# Patient Record
Sex: Female | Born: 1940
Health system: Southern US, Community
[De-identification: ages and names within clinical notes are randomized; demographics above are authoritative.]

## PROBLEM LIST (undated history)

## (undated) DIAGNOSIS — I10 Essential (primary) hypertension: Secondary | ICD-10-CM

## (undated) DIAGNOSIS — R001 Bradycardia, unspecified: Secondary | ICD-10-CM

## (undated) HISTORY — PX: ABDOMINAL HYSTERECTOMY: SHX81

## (undated) HISTORY — DX: Bradycardia, unspecified: R00.1

---

## 2014-08-26 ENCOUNTER — Inpatient Hospital Stay (HOSPITAL_COMMUNITY)
Admission: EM | Admit: 2014-08-26 | Discharge: 2014-08-28 | DRG: 310 | Disposition: A | Discharge status: Home / Self Care | Payer: Medicare Other | Attending: Internal Medicine | Admitting: Internal Medicine

## 2014-08-26 ENCOUNTER — Other Ambulatory Visit (HOSPITAL_COMMUNITY): Payer: Self-pay

## 2014-08-26 ENCOUNTER — Inpatient Hospital Stay (HOSPITAL_COMMUNITY): Payer: Medicare Other

## 2014-08-26 ENCOUNTER — Encounter (HOSPITAL_COMMUNITY): Payer: Self-pay | Admitting: Emergency Medicine

## 2014-08-26 ENCOUNTER — Emergency Department (HOSPITAL_COMMUNITY): Payer: Medicare Other

## 2014-08-26 DIAGNOSIS — R001 Bradycardia, unspecified: Secondary | ICD-10-CM | POA: Diagnosis present

## 2014-08-26 DIAGNOSIS — Z91013 Allergy to seafood: Secondary | ICD-10-CM

## 2014-08-26 DIAGNOSIS — Z950 Presence of cardiac pacemaker: Secondary | ICD-10-CM

## 2014-08-26 DIAGNOSIS — R079 Chest pain, unspecified: Secondary | ICD-10-CM

## 2014-08-26 DIAGNOSIS — I10 Essential (primary) hypertension: Secondary | ICD-10-CM | POA: Diagnosis present

## 2014-08-26 DIAGNOSIS — R0602 Shortness of breath: Secondary | ICD-10-CM

## 2014-08-26 DIAGNOSIS — I455 Other specified heart block: Secondary | ICD-10-CM | POA: Diagnosis present

## 2014-08-26 DIAGNOSIS — Z959 Presence of cardiac and vascular implant and graft, unspecified: Secondary | ICD-10-CM

## 2014-08-26 DIAGNOSIS — T82529D Displacement of unspecified cardiac and vascular devices and implants, subsequent encounter: Secondary | ICD-10-CM

## 2014-08-26 HISTORY — DX: Essential (primary) hypertension: I10

## 2014-08-26 LAB — CBC WITH DIFFERENTIAL/PLATELET
BASOS ABS: 0 10*3/uL (ref 0.0–0.1)
Basophils Relative: 0 % (ref 0–1)
Eosinophils Absolute: 0.2 10*3/uL (ref 0.0–0.7)
Eosinophils Relative: 2 % (ref 0–5)
HCT: 39.7 % (ref 36.0–46.0)
Hemoglobin: 12.6 g/dL (ref 12.0–15.0)
LYMPHS PCT: 26 % (ref 12–46)
Lymphs Abs: 2.5 10*3/uL (ref 0.7–4.0)
MCH: 25.6 pg — AB (ref 26.0–34.0)
MCHC: 31.7 g/dL (ref 30.0–36.0)
MCV: 80.7 fL (ref 78.0–100.0)
Monocytes Absolute: 0.5 10*3/uL (ref 0.1–1.0)
Monocytes Relative: 5 % (ref 3–12)
NEUTROS ABS: 6.6 10*3/uL (ref 1.7–7.7)
Neutrophils Relative %: 67 % (ref 43–77)
Platelets: 278 10*3/uL (ref 150–400)
RBC: 4.92 MIL/uL (ref 3.87–5.11)
RDW: 14.9 % (ref 11.5–15.5)
WBC: 9.8 10*3/uL (ref 4.0–10.5)

## 2014-08-26 LAB — MRSA PCR SCREENING: MRSA by PCR: NEGATIVE

## 2014-08-26 LAB — BASIC METABOLIC PANEL
Anion gap: 8 (ref 5–15)
BUN: 17 mg/dL (ref 6–23)
CALCIUM: 11 mg/dL — AB (ref 8.4–10.5)
CO2: 25 mmol/L (ref 19–32)
CREATININE: 1.13 mg/dL — AB (ref 0.50–1.10)
Chloride: 106 mmol/L (ref 96–112)
GFR calc Af Amer: 55 mL/min — ABNORMAL LOW (ref >90)
GFR calc non Af Amer: 48 mL/min — ABNORMAL LOW (ref >90)
GLUCOSE: 134 mg/dL — AB (ref 70–99)
Potassium: 4.6 mmol/L (ref 3.5–5.1)
SODIUM: 139 mmol/L (ref 135–145)

## 2014-08-26 LAB — TROPONIN I: TROPONIN I: 0.04 ng/mL — AB (ref <0.031)

## 2014-08-26 MED ORDER — ACETAMINOPHEN 325 MG PO TABS: 650.0000 mg | ORAL_TABLET | ORAL | Status: DC | PRN

## 2014-08-26 MED ORDER — ONDANSETRON HCL 4 MG/2ML IJ SOLN: 4.0000 mg | Freq: Four times a day (QID) | INTRAMUSCULAR | Status: DC | PRN

## 2014-08-26 MED ORDER — LORAZEPAM 2 MG/ML IJ SOLN
1.0000 mg | Freq: Once | INTRAMUSCULAR | Status: AC
  Administered 2014-08-26: 1 mg via INTRAVENOUS

## 2014-08-26 MED ORDER — MIDAZOLAM HCL 2 MG/2ML IJ SOLN
INTRAMUSCULAR | Status: AC
  Filled 2014-08-26: qty 2

## 2014-08-26 MED ORDER — CALCIUM CHLORIDE 10 % IV SOLN
1.0000 g | Freq: Once | INTRAVENOUS | Status: AC
  Administered 2014-08-26: 1 g via INTRAVENOUS

## 2014-08-26 MED ORDER — FENTANYL CITRATE 0.05 MG/ML IJ SOLN
25.0000 ug | Freq: Once | INTRAMUSCULAR | Status: AC
  Administered 2014-08-26: 25 ug via INTRAVENOUS

## 2014-08-26 MED ORDER — GLUCAGON HCL RDNA (DIAGNOSTIC) 1 MG IJ SOLR
2.0000 mg | Freq: Once | INTRAMUSCULAR | Status: AC
  Administered 2014-08-26: 2 mg via INTRAVENOUS

## 2014-08-26 MED ORDER — ASPIRIN EC 81 MG PO TBEC
81.0000 mg | DELAYED_RELEASE_TABLET | Freq: Every day | ORAL | Status: DC
  Administered 2014-08-27 – 2014-08-28 (×2): 81 mg via ORAL
  Filled 2014-08-26 (×2): qty 1

## 2014-08-26 MED ORDER — SODIUM CHLORIDE 0.9 % IV SOLN
INTRAVENOUS | Status: DC
  Administered 2014-08-26: 18:00:00 via INTRAVENOUS

## 2014-08-26 MED ORDER — MIDAZOLAM HCL 2 MG/2ML IJ SOLN
1.0000 mg | Freq: Once | INTRAMUSCULAR | Status: AC
  Administered 2014-08-26: 1 mg via INTRAVENOUS

## 2014-08-26 MED ORDER — NITROGLYCERIN 0.4 MG SL SUBL: 0.4000 mg | SUBLINGUAL_TABLET | SUBLINGUAL | Status: DC | PRN

## 2014-08-26 MED ORDER — SODIUM CHLORIDE 0.9 % IV SOLN
INTRAVENOUS | Status: DC
  Administered 2014-08-26: 14:00:00 via INTRAVENOUS

## 2014-08-26 NOTE — ED Notes (Signed)
Cardiology at the bedside. X-ray at the bedside with the C arm to place external pacemaker.

## 2014-08-26 NOTE — ED Provider Notes (Signed)
CSN: [id number]     Arrival date & time 08/26/14  1348 History   First MD Initiated Contact with Patient 08/26/14 1356     Chief Complaint  Patient presents with  . Bradycardia     (Consider location/radiation/quality/duration/timing/severity/associated sxs/prior Treatment) HPI Comments: Patient here with son onset of loss of consciousness while in church. Patient denied any chest pain chest pressure. States she feels that the room was spinning. EMS was called and patient found to have a heart rate of 20 and in a sinus bradycardia. She was given atropine 0.5 mg if no increase of heart rate. Blood pressure was noted to be 80 and patient given IV fluids and was externally paced. She denies any prior history of coronary artery disease. No prior history of arrhythmias. She is on a beta blocker and calcium channel blocker. She presents at this time  The history is provided by the patient.    No past medical history on file. No past surgical history on file. No family history on file. History  Substance Use Topics  . Smoking status: Not on file  . Smokeless tobacco: Not on file  . Alcohol Use: Not on file   OB History    No data available     Review of Systems  All other systems reviewed and are negative.     Allergies  Review of patient's allergies indicates not on file.  Home Medications   Prior to Admission medications   Not on File   BP 122/84 mmHg  Pulse 70  Temp(Src) 97.9 F (36.6 C) (Oral)  Resp 22  Ht  (1.549 m)  Wt 160 lb (72.576 kg)  BMI 30.25 kg/m2  SpO2 100% Physical Exam  Constitutional: She is oriented to person, place, and time. She appears well-developed and well-nourished.  Non-toxic appearance. No distress.  HENT:  Head: Normocephalic and atraumatic.  Eyes: Conjunctivae, EOM and lids are normal. Pupils are equal, round, and reactive to light.  Neck: Normal range of motion. Neck supple. No tracheal deviation present. No thyroid mass present.   Cardiovascular: Regular rhythm and normal heart sounds.  Bradycardia present.  Exam reveals no gallop.   No murmur heard. Pulmonary/Chest: Effort normal and breath sounds normal. No stridor. No respiratory distress. She has no decreased breath sounds. She has no wheezes. She has no rhonchi. She has no rales.  Abdominal: Soft. Normal appearance and bowel sounds are normal. She exhibits no distension. There is no tenderness. There is no rebound and no CVA tenderness.  Musculoskeletal: Normal range of motion. She exhibits no edema or tenderness.  Neurological: She is alert and oriented to person, place, and time. She has normal strength. No cranial nerve deficit or sensory deficit. GCS eye subscore is 4. GCS verbal subscore is 5. GCS motor subscore is 6.  Skin: Skin is warm and dry. No abrasion and no rash noted.  Psychiatric: Her affect is blunt.  Nursing note and vitals reviewed.   ED Course  Procedures (including critical care time) Labs Review Labs Reviewed  CBC WITH DIFFERENTIAL/PLATELET  TROPONIN I  BASIC METABOLIC PANEL    Imaging Review No results found.   EKG Interpretation   Date/Time:  Saturday August 26 2014 13:51:33 EST Ventricular Rate:  70 PR Interval:    QRS Duration: 258 QT Interval:  528 QTC Calculation: 570 R Axis:   -48 Text Interpretation:  Poor data quality in current ECG precludes serial  comparison Confirmed by Marja Adderley  MD, Tahjai Schetter ([id number]) on 08/26/2014  2:05:12 PM     Patient is in a paced rhythm at this time at a rate of 70. MDM   Final diagnoses:  Chest pain   patient is externally paced at this time. She has been given calcium chloride as well as glucagon for possible calcium channel and/or beta blocker excessive effects. Cardiologist will be consult and patient be admitted to the service   CRITICAL CARE Performed by: Toy BakerALLEN,Laquitha Heslin T Total critical care time: 45 Critical care time was exclusive of separately billable procedures and treating other  patients. Critical care was necessary to treat or prevent imminent or life-threatening deterioration. Critical care was time spent personally by me on the following activities: development of treatment plan with patient and/or surrogate as well as nursing, discussions with consultants, evaluation of patient's response to treatment, examination of patient, obtaining history from patient or surrogate, ordering and performing treatments and interventions, ordering and review of laboratory studies, ordering and review of radiographic studies, pulse oximetry and re-evaluation of patient's condition.       Toy BakerAnthony T Jersie Beel, MD 08/26/14 [phone]

## 2014-08-26 NOTE — ED Notes (Signed)
Family at bedside. Updated  By MD , awaiting cath lab

## 2014-08-26 NOTE — ED Notes (Signed)
Pacemaker line placed under fluro.

## 2014-08-26 NOTE — Progress Notes (Signed)
eLink Physician-Brief Progress Note Patient Name: Kristi BinetHazel Ward DOB: Dec 26, 1940 MRN: [id number]   Date of Service  08/26/2014  HPI/Events of Note  74 yo with symptomatic bradycardia, now with temporary pacemaker  eICU Interventions  Avoid nodal blocker agents DVT\GI prophylaxis Further management per cardiology.      Intervention Category Evaluation Type: New Patient Evaluation  Lewellyn Fultz 08/26/2014, 6:04 PM

## 2014-08-26 NOTE — H&P (Signed)
    Leodis BinetHazel Gadberry is an 74 y.o. female.    Primary Cardiologist:new No primary care provider on file.  Chief Complaint: weak HPI: 74 year old female has not felt well for 2 months but worse today.  She was at church and felt the room spinning, then lost consciousness.  EMS arrived, HR 20 sinus brady.  She has been temporarily paced since then. Atropine, calcium, and glucagon were given without effect.  SBP > 200 with temporary pacing.   She is on Coreg 25 mg bid and verapamil at home for HTN.  No prior cardiology evaluation.   Past Medical History  Diagnosis Date  . Hypertension     Past Surgical History  Procedure Laterality Date  . Abdominal hysterectomy      History reviewed. No pertinent family history. no cardiac issues  Social History:  reports that she has never smoked. She does not have any smokeless tobacco history on file. She reports that she does not drink alcohol or use illicit drugs.  Allergies:  Allergies  Allergen Reactions  . Crab [Shellfish Allergy]     Coreg verapamil No results found for this or any previous visit (from the past 48 hour(s)). No results found.  ROS: Unable to obtain due to discomfort   Blood pressure 122/84, pulse 70, temperature 97.9 F (36.6 C), temperature source Oral, resp. rate 22, height 5\' 1"  (1.549 m), weight 160 lb (72.576 kg), SpO2 100 %. General: uncomfortable, temporary pacing Neck: No JVD, no thyromegaly or thyroid nodule.  Lungs: Clear to auscultation bilaterally with normal respiratory effort. CV: Nondisplaced PMI.  External pacing, no murmur.  No peripheral edema.  No carotid bruit.  Normal pedal pulses.  Abdomen: Soft, nontender, no hepatosplenomegaly, no distention.  Skin: Intact without lesions or rashes.  Neurologic: Alert and oriented x 3.  Psych: Normal affect. Extremities: No clubbing or cyanosis.  HEENT: Normal.    Assessment/Plan Symptomatic bradycardia HR 19 HTN  INGOLD,LAURA R Nurse Practitioner  Certified [facility] Pager [phone] or after 5pm or weekends call [phone] 08/26/2014, 2:16 PM   Patient seen with NP, agree with the above note.  Profound bradycardia due to sinus node dysfunction (sinus bradycardia initially) in the setting of Coreg and verapamil use.  She will need temporary transvenous pacing today.  Will then allow Coreg and verapamil wash-out to decide on PPM (will likely need).  She will need echo.   Marca AnconaDalton Alicia Seib 08/26/2014 2:26 PM

## 2014-08-26 NOTE — Significant Event (Signed)
  Pacer noticed to be atrial location due to lack of capture.   Attempted to use Fluoro, the fluoro machine did not fit under the bed  Advanced pacer wire with balloon inflated with ventricular capture, however, patient started moving and wired fell back into atrium. The temp pacer does not have a lock.   Patient remains with native rate at 50s-70s, and pads on, she is hemodynamically stable and asymptomatic. Plan to closely monitor patient and adjust or pull pacer wire in AM in cath lab.

## 2014-08-26 NOTE — ED Notes (Signed)
Attempted to cut of temporary pacer, upon cutting pacer off pt heart rate down to 19, pt went slightly unresponsive with snoring respirations , temp pacer cut back on

## 2014-08-26 NOTE — CV Procedure (Signed)
Leodis BinetHazel Ward [id number]  [id number]  Preop Dx: sinus arrest symptomatic brfadycardia Postop Dx same/   Procedure temp   Cx: None   EBL: Minimal   After reoutine prep and drape, lidocaine infiltrated andn venous access obrtained wirth placement of ballloon tipped catheter into ERV apex with threshookld < 0.5  The pt jumped with her leg, and pacing was then noted with narrow qrs and flouro confirmed atrial location  Lead redeployed in RV apex undedr flouro threshold < 0.5   Pt admitted   Tolerated well   Sherryl MangesSteven Klein, MD 08/26/2014 3:38 PM

## 2014-08-26 NOTE — ED Notes (Signed)
Pt here from church after being called out for unconscious, pt hr was 20 at the church  SB on monitor , pt received .5mg  of atropine that did not help , pt was then paced by ems , pt arrived still being paced

## 2014-08-27 ENCOUNTER — Inpatient Hospital Stay (HOSPITAL_COMMUNITY): Payer: Medicare Other

## 2014-08-27 DIAGNOSIS — I455 Other specified heart block: Secondary | ICD-10-CM

## 2014-08-27 DIAGNOSIS — R55 Syncope and collapse: Secondary | ICD-10-CM

## 2014-08-27 LAB — BASIC METABOLIC PANEL
ANION GAP: 7 (ref 5–15)
BUN: 9 mg/dL (ref 6–23)
CO2: 25 mmol/L (ref 19–32)
Calcium: 8.9 mg/dL (ref 8.4–10.5)
Chloride: 109 mmol/L (ref 96–112)
Creatinine, Ser: 0.76 mg/dL (ref 0.50–1.10)
GFR calc Af Amer: >90 mL/min (ref >90)
GFR calc non Af Amer: 83 mL/min — ABNORMAL LOW (ref >90)
GLUCOSE: 92 mg/dL (ref 70–99)
POTASSIUM: 3.9 mmol/L (ref 3.5–5.1)
Sodium: 141 mmol/L (ref 135–145)

## 2014-08-27 LAB — CBC
HCT: 36 % (ref 36.0–46.0)
Hemoglobin: 11.1 g/dL — ABNORMAL LOW (ref 12.0–15.0)
MCH: 24.9 pg — ABNORMAL LOW (ref 26.0–34.0)
MCHC: 30.8 g/dL (ref 30.0–36.0)
MCV: 80.9 fL (ref 78.0–100.0)
Platelets: 371 10*3/uL (ref 150–400)
RBC: 4.45 MIL/uL (ref 3.87–5.11)
RDW: 15 % (ref 11.5–15.5)
WBC: 9.5 10*3/uL (ref 4.0–10.5)

## 2014-08-27 LAB — TSH: TSH: 2.033 u[IU]/mL (ref 0.350–4.500)

## 2014-08-27 LAB — TROPONIN I: Troponin I: 0.08 ng/mL — ABNORMAL HIGH (ref <0.031)

## 2014-08-27 MED ORDER — HYDRALAZINE HCL 50 MG PO TABS
50.0000 mg | ORAL_TABLET | Freq: Three times a day (TID) | ORAL | Status: DC
  Administered 2014-08-27 – 2014-08-28 (×4): 50 mg via ORAL
  Filled 2014-08-27 (×6): qty 1

## 2014-08-27 MED ORDER — HYDRALAZINE HCL 20 MG/ML IJ SOLN
20.0000 mg | Freq: Once | INTRAMUSCULAR | Status: AC
  Administered 2014-08-27: 20 mg via INTRAVENOUS

## 2014-08-27 MED ORDER — SODIUM CHLORIDE 0.9 % IV SOLN
INTRAVENOUS | Status: DC
  Administered 2014-08-26: 20:00:00 via INTRAVENOUS

## 2014-08-27 MED ORDER — HYDRALAZINE HCL 25 MG PO TABS
25.0000 mg | ORAL_TABLET | Freq: Four times a day (QID) | ORAL | Status: DC
  Administered 2014-08-27: 25 mg via ORAL
  Filled 2014-08-27 (×4): qty 1

## 2014-08-27 MED ORDER — HYDRALAZINE HCL 20 MG/ML IJ SOLN
INTRAMUSCULAR | Status: AC
  Filled 2014-08-27: qty 1

## 2014-08-27 NOTE — Progress Notes (Signed)
  Echocardiogram 2D Echocardiogram has been performed.  Kristi Ward R 08/27/2014, 3:12 PM 

## 2014-08-27 NOTE — Progress Notes (Signed)
  Echocardiogram 2D Echocardiogram has been performed.  Janalyn HarderWest, Dajohn Ellender R 08/27/2014, 3:12 PM

## 2014-08-27 NOTE — Progress Notes (Signed)
Patient ID: Kristi BinetHazel Ward, female   DOB: [birth year], 74 y.o.   MRN: [id number]   SUBJECTIVE: Temporary transvenous pacemaker placed yesterday but lost capture and was unable to be successfully repositioned at bedside.  However, patient has been in NSR with HR ranging from 50s-70s.  No complaints, no dyspnea or chest pain.  Scheduled Meds: . aspirin EC  81 mg Oral Daily   Continuous Infusions: . sodium chloride 50 mL/hr at 08/26/14 1815  . sodium chloride 10 mL/hr at 08/26/14 2000   PRN Meds:.acetaminophen, nitroGLYCERIN, ondansetron (ZOFRAN) IV    Filed Vitals:   08/27/14 0300 08/27/14 0400 08/27/14 0500 08/27/14 0600  BP: 175/71 174/51 169/66 153/59  Pulse: 71 55 65 55  Temp:  97.4 F (36.3 C)    TempSrc:  Oral    Resp: 18 23 21 19   Height:      Weight:      SpO2: 100% 97% 97% 98%    Intake/Output Summary (Last 24 hours) at 08/27/14 0743 Last data filed at 08/27/14 0600  Gross per 24 hour  Intake  687.5 ml  Output   1600 ml  Net -912.5 ml    LABS: Basic Metabolic Panel:  Recent Labs  11/91/4701/11/05 1407  NA 139  K 4.6  CL 106  CO2 25  GLUCOSE 134*  BUN 17  CREATININE 1.13*  CALCIUM 11.0*   Liver Function Tests: No results for input(s): AST, ALT, ALKPHOS, BILITOT, PROT, ALBUMIN in the last 72 hours. No results for input(s): LIPASE, AMYLASE in the last 72 hours. CBC:  Recent Labs  08/26/14 1407  WBC 9.8  NEUTROABS 6.6  HGB 12.6  HCT 39.7  MCV 80.7  PLT 278   Cardiac Enzymes:  Recent Labs  08/26/14 1407  TROPONINI 0.04*   BNP: Invalid input(s): POCBNP D-Dimer: No results for input(s): DDIMER in the last 72 hours. Hemoglobin A1C: No results for input(s): HGBA1C in the last 72 hours. Fasting Lipid Panel: No results for input(s): CHOL, HDL, LDLCALC, TRIG, CHOLHDL, LDLDIRECT in the last 72 hours. Thyroid Function Tests:  Recent Labs  08/26/14 2235  TSH 2.033   Anemia Panel: No results for input(s): VITAMINB12, FOLATE, FERRITIN, TIBC, IRON,  RETICCTPCT in the last 72 hours.  RADIOLOGY: Dg Chest Port 1 View  08/27/2014   CLINICAL DATA:  Displacement of cardiac device.  EXAM: PORTABLE CHEST - 1 VIEW  COMPARISON:  08/26/2014 at 2327 hours  FINDINGS: A temporary pacer wire from below projects over the lower right atrium, near the inferior cavoatrial junction.  Stable cardiomegaly. Stable aortic tortuosity. No failure. No effusion or pneumothorax.  IMPRESSION: 1. Temporary pacer wire is near the inferior cavoatrial junction. 2. Cardiomegaly without failure.   Electronically Signed   By: Marnee SpringJonathon  Watts M.D.   On: 08/27/2014 02:58   Portable Chest X-ray 1 View  08/27/2014   CLINICAL DATA:  Follow up cardiac device lead position. Subsequent encounter.  EXAM: PORTABLE CHEST - 1 VIEW  COMPARISON:  Chest radiograph performed earlier today at 4:01 p.m.  FINDINGS: A metallic device is noted overlying the mediastinum, with associated leads overlying the mediastinum. One of the leads appears to have been repositioned since the prior study. A lead is seen ending overlying the expected location of the right ventricle.  Vascular congestion is noted. An external pacing pad is seen on the left. No pleural effusion or pneumothorax is seen.  The cardiomediastinal silhouette is enlarged. No acute osseous abnormalities are identified.  IMPRESSION: 1. Lead noted ending overlying  the expected location of the right ventricle. 2. At congestion and cardiomegaly; lungs remain grossly clear.   Electronically Signed   By: Roanna Raider M.D.   On: 08/27/2014 01:06   Dg Chest Port 1 View  08/27/2014   CLINICAL DATA:  Shortness of breath  EXAM: PORTABLE CHEST - 1 VIEW  COMPARISON:  08/26/2014  FINDINGS: Cardiomegaly and aortic tortuosity which is stable from priors. With improved inflation previously noted pulmonary venous congestion is now not apparent. There is no edema, consolidation, effusion, or pneumothorax. Previously noted pacer wire is not visible.  IMPRESSION: 1.  Previously noted pacer wire is not visible, possibly due to underpenetration. 2. Cardiomegaly without failure.   Electronically Signed   By: Marnee Spring M.D.   On: 08/27/2014 00:05   Dg Chest Port 1 View  08/26/2014   CLINICAL DATA:  Acute chest pain.  EXAM: PORTABLE CHEST - 1 VIEW  COMPARISON:  None.  FINDINGS: Cardiomegaly noted.  Mild pulmonary vascular congestion is identified.  A defibrillator pad overlying the left chest is identified.  There is no evidence of focal airspace disease, pulmonary edema, suspicious pulmonary nodule/mass, pleural effusion, or pneumothorax. No acute bony abnormalities are identified.  IMPRESSION: Cardiomegaly with mild pulmonary vascular congestion.   Electronically Signed   By: Harmon Pier M.D.   On: 08/26/2014 17:09   Dg Fluoro Guide Cv Line-no Report  08/26/2014   CLINICAL DATA:    FLOURO GUIDE CV LINE  Fluoroscopy was utilized by the requesting physician.  No radiographic  interpretation.     PHYSICAL EXAM General: NAD Neck: No JVD, no thyromegaly or thyroid nodule.  Lungs: Clear to auscultation bilaterally with normal respiratory effort. CV: Nondisplaced PMI.  Heart regular S1/S2, no S3/S4, no murmur.  No peripheral edema.  No carotid bruit.  Normal pedal pulses.  Abdomen: Soft, nontender, no hepatosplenomegaly, no distention.  Neurologic: Alert and oriented x 3.  Psych: Normal affect. Extremities: No clubbing or cyanosis.   TELEMETRY: Reviewed telemetry pt in NSR rate 50s-70s  ASSESSMENT AND PLAN: 74 yo with history of HTN on Coreg and verapamil and high doses presented with profound bradycardia and loss of consciousness.  Temporary transvenous pacer was placed but lost capture with leg movement.  Last night, it could not be successfully repositioned.  However, she has had a normal native rhythm overnight and this morning, HR 50s-70s NSR.  - I turned off the pacer this morning.  Will observe through the day.  If she becomes bradycardic can reposition  in cath lab.  If not, it can be removed this afternoon.  - Echocardiogram today.  - Needs BMET/CBC.   [name] 08/27/2014 7:47 AM

## 2014-08-27 NOTE — Progress Notes (Signed)
MD notified of pacer not firing appropriately or capturing. MD to bedside. 12 EKG performed. Chest XRay obtained. Pacer wires misplaced. MD attempted to advance pacer wires unsuccessfully. Pt native HR 70s-80s while awake. Zoll pads in place. Vital signs stable. Foley placed to maintain pacer wire placement. Will continue to monitor.

## 2014-08-27 NOTE — Progress Notes (Addendum)
Foley catheter inserted using sterile technique with Edson SnowballShana RN. Peri care preformed prior to insertion. Foley inserted for maintenance of temporary pacer placement and acute urinary retention.

## 2014-08-28 ENCOUNTER — Encounter (HOSPITAL_COMMUNITY)
Admission: EM | Disposition: A | Discharge status: Home / Self Care | Payer: Self-pay | Source: Home / Self Care | Attending: Internal Medicine

## 2014-08-28 ENCOUNTER — Other Ambulatory Visit: Payer: Self-pay | Admitting: Physician Assistant

## 2014-08-28 ENCOUNTER — Encounter (HOSPITAL_COMMUNITY): Payer: Self-pay | Admitting: *Deleted

## 2014-08-28 DIAGNOSIS — E876 Hypokalemia: Secondary | ICD-10-CM

## 2014-08-28 DIAGNOSIS — R001 Bradycardia, unspecified: Secondary | ICD-10-CM

## 2014-08-28 DIAGNOSIS — I10 Essential (primary) hypertension: Secondary | ICD-10-CM | POA: Diagnosis present

## 2014-08-28 LAB — CBC
HEMATOCRIT: 36.5 % (ref 36.0–46.0)
Hemoglobin: 11.2 g/dL — ABNORMAL LOW (ref 12.0–15.0)
MCH: 24.7 pg — ABNORMAL LOW (ref 26.0–34.0)
MCHC: 30.7 g/dL (ref 30.0–36.0)
MCV: 80.6 fL (ref 78.0–100.0)
PLATELETS: 378 10*3/uL (ref 150–400)
RBC: 4.53 MIL/uL (ref 3.87–5.11)
RDW: 15.1 % (ref 11.5–15.5)
WBC: 9.4 10*3/uL (ref 4.0–10.5)

## 2014-08-28 LAB — BASIC METABOLIC PANEL
ANION GAP: 4 — AB (ref 5–15)
BUN: 11 mg/dL (ref 6–23)
CHLORIDE: 106 mmol/L (ref 96–112)
CO2: 29 mmol/L (ref 19–32)
Calcium: 8.8 mg/dL (ref 8.4–10.5)
Creatinine, Ser: 0.81 mg/dL (ref 0.50–1.10)
GFR calc non Af Amer: 71 mL/min — ABNORMAL LOW (ref >90)
GFR, EST AFRICAN AMERICAN: 83 mL/min — AB (ref >90)
Glucose, Bld: 98 mg/dL (ref 70–99)
Potassium: 3.4 mmol/L — ABNORMAL LOW (ref 3.5–5.1)
SODIUM: 139 mmol/L (ref 135–145)

## 2014-08-28 LAB — HEMOGLOBIN A1C
Hgb A1c MFr Bld: 6.3 % — ABNORMAL HIGH (ref 4.8–5.6)
Mean Plasma Glucose: 134 mg/dL

## 2014-08-28 SURGERY — PERMANENT PACEMAKER INSERTION
Anesthesia: LOCAL

## 2014-08-28 MED ORDER — SPIRONOLACTONE-HCTZ 25-25 MG PO TABS: 1.0000 | ORAL_TABLET | Freq: Every day | ORAL | Status: DC

## 2014-08-28 MED ORDER — HYDRALAZINE HCL 50 MG PO TABS: 50.0000 mg | ORAL_TABLET | Freq: Three times a day (TID) | ORAL | Status: DC

## 2014-08-28 MED ORDER — POTASSIUM CHLORIDE CRYS ER 20 MEQ PO TBCR
40.0000 meq | EXTENDED_RELEASE_TABLET | Freq: Once | ORAL | Status: AC
  Administered 2014-08-28: 40 meq via ORAL
  Filled 2014-08-28: qty 2

## 2014-08-28 MED ORDER — LISINOPRIL 10 MG PO TABS: 10.0000 mg | ORAL_TABLET | Freq: Every day | ORAL | Status: DC

## 2014-08-28 MED ORDER — LISINOPRIL 10 MG PO TABS
10.0000 mg | ORAL_TABLET | Freq: Every day | ORAL | Status: DC
  Administered 2014-08-28: 10 mg via ORAL
  Filled 2014-08-28: qty 1

## 2014-08-28 MED ORDER — SPIRONOLACTONE 25 MG PO TABS
25.0000 mg | ORAL_TABLET | Freq: Every day | ORAL | Status: DC
  Administered 2014-08-28: 25 mg via ORAL
  Filled 2014-08-28: qty 1

## 2014-08-28 MED ORDER — HYDROCHLOROTHIAZIDE 25 MG PO TABS
25.0000 mg | ORAL_TABLET | Freq: Every day | ORAL | Status: DC
  Administered 2014-08-28: 25 mg via ORAL
  Filled 2014-08-28: qty 1

## 2014-08-28 NOTE — Consult Note (Addendum)
ELECTROPHYSIOLOGY CONSULT NOTE   Patient ID: Kristi Ward MRN: [id number] DOB/AGE: [birth year] 74 y.o.  Admit date: 08/26/2014  Primary Physician   Dr. Sherril Croon Primary Cardiologist   Dr. Shirlee Latch this admission. Reason for Consultation   Sinus arrest  MVH:QIONG Acocella is a 74 y.o. year old female with a history of HTN, previously on Coreg and Verapamil. Admitted with sinus arrest. Pt had temp pacer placed but lost capture w/ movement and it was removed. EP to see and determine if PPM needed.  [name] has been on multi-drug therapy for her BP for years. Currently on Dyazide, Verapamil 240 and Coreg 25 bid. She believes she has been on this regimen for 2 or more years.   Until recently, she was not having any symptoms. She developed dizziness about 2 months ago that progressed gradually. She had presyncope with dizziness and then a syncopal episode at church 03/05. HR was 24 bpm, sinus bradycardia and did not respond to atropine, calcium or glucagon. She responded to pacing but the wire became dislodged with movement and could not be repositioned. The temp wire was removed. Her heart rate had normalized by that time and she has been SR, sinus bradycardia in the 50s since then. She has PVCs, several per minute but rarely feels any palpitations. She has never had a cardiology evaluation before.  Potassium was 4.6 on admission and TSH was normal. No significant laboratory abnormalities noted. EF normal by echo, grade 1 diastolic dysfunction.  Past Medical History  Diagnosis Date  . Hypertension      Past Surgical History  Procedure Laterality Date  . Abdominal hysterectomy      Allergies  Allergen Reactions  . Crab [Shellfish Allergy] Hives    I have reviewed the patient's current medications . aspirin EC  81 mg Oral Daily  . hydrALAZINE  50 mg Oral 3 times per day   . sodium chloride 50 mL/hr at 08/26/14 1815  . sodium chloride 10 mL/hr at 08/26/14 2000    acetaminophen, nitroGLYCERIN, ondansetron (ZOFRAN) IV  Prior to Admission medications   1.Triamterene/HCTZ 37.5/25 MG qd 2. Coreg 25 mg bid 3. Verapamil ER 240 mg qd    History   Social History  . Marital Status: Married    Spouse Name: N/A  . Number of Children: N/A  . Years of Education: N/A   Occupational History  . Retired    Social History Main Topics  . Smoking status: Never Smoker   . Smokeless tobacco: Not on file  . Alcohol Use: No  . Drug Use: No  . Sexual Activity: Not on file   Other Topics Concern  . Not on file   Social History Narrative   Lives in [place], [place] with her husband.    Family Status  Relation Status Death Age  . Mother Deceased   . Father Deceased    ROS:  Full 14 point review of systems complete and found to be negative unless listed above.  Physical Exam: Blood pressure 178/71, pulse 57, temperature 98.9 F (37.2 C), temperature source Oral, resp. rate 23, height  (1.549 m), weight 205 lb 14.6 oz (93.4 kg), SpO2 96 %.  General: Well developed, well nourished, female in no acute distress Head: Eyes PERRLA, No xanthomas.   Normocephalic and atraumatic, oropharynx without edema or exudate. Dentition: good Lungs: Clear  Heart: HRRR S1 S2, no rub/gallop, no murmur. pulses are 2+ all 4 extrem.   Neck: No carotid  bruits. No lymphadenopathy.  JVD not elevated. Abdomen: Bowel sounds present, abdomen soft and non-tender without masses or hernias noted. Msk:  No spine or cva tenderness. No weakness, no joint deformities or effusions. Extremities: No clubbing or cyanosis. no edema.  Neuro: Alert and oriented X 3. No focal deficits noted. Psych:  Good affect, responds appropriately Skin: No rashes or lesions noted.  Labs:   Lab Results  Component Value Date   WBC 9.4 08/28/2014   HGB 11.2* 08/28/2014   HCT 36.5 08/28/2014   MCV 80.6 08/28/2014   PLT 378 08/28/2014    Recent Labs Lab 08/28/14 0328  NA 139  K 3.4*  CL 106  CO2  29  BUN 11  CREATININE 0.81  CALCIUM 8.8  GLUCOSE 98    Recent Labs  08/26/14 1407 08/27/14 0800  TROPONINI 0.04* 0.08*   TSH  Date/Time Value Ref Range Status  08/26/2014 10:35 PM 2.033 0.350 - 4.500 uIU/mL Final    Echo: 08/27/2014 Conclusions - Procedure narrative: Transthoracic echocardiography. Image quality was suboptimal. The study was technically difficult, as a result of poor acoustic windows, poor sound wave transmission, and body habitus. - Left ventricle: The cavity size was normal. There was mild concentric hypertrophy. Systolic function was normal. The estimated ejection fraction was in the range of 60% to 65%. Wall motion was normal; there were no regional wall motion abnormalities. Doppler parameters are consistent with abnormal left ventricular relaxation (grade 1 diastolic dysfunction). Doppler parameters are consistent with high ventricular fillingpressure. - Aortic valve: Mildly calcified annulus. - Left atrium: The atrium was mildly dilated. Volume/bsa, S: 33.7 ml/m^2.  ECG:  08/28/2014 SR Vent. rate 64 BPM PR interval 164 ms QRS duration 86 ms QT/QTc 400/412 ms P-R-T axes 70 40 71  Radiology:  Dg Chest Port 1 View 08/27/2014   CLINICAL DATA:  Displacement of cardiac device.  EXAM: PORTABLE CHEST - 1 VIEW  COMPARISON:  08/26/2014 at 2327 hours  FINDINGS: A temporary pacer wire from below projects over the lower right atrium, near the inferior cavoatrial junction.  Stable cardiomegaly. Stable aortic tortuosity. No failure. No effusion or pneumothorax.  IMPRESSION: 1. Temporary pacer wire is near the inferior cavoatrial junction. 2. Cardiomegaly without failure.   Electronically Signed   By: Marnee Spring M.D.   On: 08/27/2014 02:58   Portable Chest X-ray 1 View 08/27/2014   CLINICAL DATA:  Follow up cardiac device lead position. Subsequent encounter.  EXAM: PORTABLE CHEST - 1 VIEW  COMPARISON:  Chest radiograph performed earlier today  at 4:01 p.m.  FINDINGS: A metallic device is noted overlying the mediastinum, with associated leads overlying the mediastinum. One of the leads appears to have been repositioned since the prior study. A lead is seen ending overlying the expected location of the right ventricle.  Vascular congestion is noted. An external pacing pad is seen on the left. No pleural effusion or pneumothorax is seen.  The cardiomediastinal silhouette is enlarged. No acute osseous abnormalities are identified.  IMPRESSION: 1. Lead noted ending overlying the expected location of the right ventricle. 2. At congestion and cardiomegaly; lungs remain grossly clear.   Electronically Signed   By: Roanna Raider M.D.   On: 08/27/2014 01:06   Dg Chest Port 1 View 08/27/2014   CLINICAL DATA:  Shortness of breath  EXAM: PORTABLE CHEST - 1 VIEW  COMPARISON:  08/26/2014  FINDINGS: Cardiomegaly and aortic tortuosity which is stable from priors. With improved inflation previously noted pulmonary venous congestion is now  not apparent. There is no edema, consolidation, effusion, or pneumothorax. Previously noted pacer wire is not visible.  IMPRESSION: 1. Previously noted pacer wire is not visible, possibly due to underpenetration. 2. Cardiomegaly without failure.   Electronically Signed   By: Marnee SpringJonathon  Watts M.D.   On: 08/27/2014 00:05   Dg Chest Port 1 View 08/26/2014   CLINICAL DATA:  Acute chest pain.  EXAM: PORTABLE CHEST - 1 VIEW  COMPARISON:  None.  FINDINGS: Cardiomegaly noted.  Mild pulmonary vascular congestion is identified.  A defibrillator pad overlying the left chest is identified.  There is no evidence of focal airspace disease, pulmonary edema, suspicious pulmonary nodule/mass, pleural effusion, or pneumothorax. No acute bony abnormalities are identified.  IMPRESSION: Cardiomegaly with mild pulmonary vascular congestion.   Electronically Signed   By: Harmon PierJeffrey  Hu M.D.   On: 08/26/2014 17:09   Dg Fluoro Guide Cv Line-no Report 08/26/2014    CLINICAL DATA:    FLOURO GUIDE CV LINE  Fluoroscopy was utilized by the requesting physician.  No radiographic  interpretation.     ASSESSMENT AND PLAN:   The patient was seen today by Dr Graciela HusbandsKlein, the patient evaluated and the data reviewed.  Principal Problem:   Symptomatic bradycardia - HR 24 per EMS strip review - improved with discontinuing BB and CCB - still has sinus brady on telemetry with a sinus arrhythmia - will ambulated to see if has chronotropic incompetence - MD advise if PPM needed at this time  Active Problems:   Sinus arrest - see above    Hypertension - now on hydralazine to help with BP control - MD advise on restarting Dyazide. - will supplement K+, was normal on admission.  Signed: Theodore DemarkRhonda Barrett, PA-C 08/28/2014 7:53 AM Alvino ChapelBeeper [phone]  Co-Sign MD Add aldactazide for BP control 25 bid 30 day outpt monitor as 50% of these patients will end up with recurrent sinus node dysfunction requiring pacing Will arrange followup in [facility] rov with [facility] if available in 4-5 weeks or with me in [place]   Will need BMET checked in 1 week and at [place]

## 2014-08-28 NOTE — Discharge Summary (Signed)
CARDIOLOGY DISCHARGE SUMMARY   Patient ID: Derrian Rodak MRN: [id number] DOB/AGE: 72-Jan-1942 72 y.o.  Admit date: 08/26/2014 Discharge date: 08/28/2014  PCP: Dr. Sherril Croon Primary Cardiologist: New to Dr. Shirlee Latch Electrophysiologist: Dr. Graciela Husbands  Primary Discharge Diagnosis:  Symptomatic bradycardia Secondary Discharge Diagnosis:    Sinus arrest   Hypertension   Consults: Electrophysiology  Procedures: 2-D echocardiogram, and insertion of a temporary transvenous pacer, chest x-rays  Hospital Course: Athelene Hursey is a 74 y.o. female with no history of CAD. She had been on carvedilol and verapamil for hypertension control. She had been on these medications with no changes recently. She noted gradually increasing dizziness that progressed to a syncopal episode on the day of admission.  EMS was called and upon their arrival they describe sinus arrest, with strip review showing severe sinus bradycardia with a heart rate of 24. She was externally paced by EMS and transported emergency to the hospital. In the emergency room, her heart rate did not improve with atropine, glucagon or calcium. A temporary wire was requested.  She had a temporary pacing wire inserted on 08/27/2014 without complication. She tolerated the procedure well. However, they lost capture later after she had moved her leg. An attempt was made to reposition the wire and regaining capture but it was unsuccessful. By this time, her heart rate had improved and was sinus rhythm in the 60s, with no pauses.  On 03/07, she was seen by Dr. Graciela Husbands and all data were reviewed. Her blood pressure was significantly elevated off her home medications, so she was started on lisinopril plus Aldactazide for blood pressure control. Her labs were reviewed and showed a TSH within normal limits and no significant electrolyte abnormalities. Her potassium dropped overnight but she had been getting hydration. Her potassium had been normal on admission.  She had a minimal elevation in her troponin that was felt secondary to the stress of the acute illness and the external pacing, did not represent a coronary occlusion. She has no history of chest pain with or without exertion.  She had an echocardiogram performed, results below. There were no regional wall motion abnormalities and her EF is preserved.  As her heart rate had improved, her rhythm was stable, and her EF was normal, no further inpatient workup was indicated. She was considered stable for discharge, to follow up as an outpatient with lab work in a week. She is to wear 30 day monitor and follow up with Dr. Graciela Husbands after that.  Labs:   Lab Results  Component Value Date   WBC 9.4 08/28/2014   HGB 11.2* 08/28/2014   HCT 36.5 08/28/2014   MCV 80.6 08/28/2014   PLT 378 08/28/2014     Recent Labs Lab 08/28/14 0328  NA 139  K 3.4*  CL 106  CO2 29  BUN 11  CREATININE 0.81  CALCIUM 8.8  GLUCOSE 98    Recent Labs  08/26/14 1407 08/27/14 0800  TROPONINI 0.04* 0.08*    Echo: 08/27/2014 Conclusions - Procedure narrative: Transthoracic echocardiography. Image quality was suboptimal. The study was technically difficult, as aresult of poor acoustic windows, poor sound wave transmission,and body habitus. - Left ventricle: The cavity size was normal. There was mild concentric hypertrophy. Systolic function was normal. The estimated ejection fraction was in the range of 60% to 65%. Wallmotion was normal; there were no regional wall motionabnormalities. Doppler parameters are consistent with abnormalleft ventricular relaxation (grade 1 diastolic dysfunction).Doppler parameters are consistent with high ventricular fillingpressure. - Aortic  valve: Mildly calcified annulus. - Left atrium: The atrium was mildly dilated. Volume/bsa, S: 33.7 ml/m^2.  ECG: 08/28/2014 SR Vent. rate 64 BPM PR interval 164 ms QRS duration 86 ms QT/QTc 400/412 ms P-R-T axes 70 40  71  Radiology: Dg Chest Port 1 View 08/27/2014 CLINICAL DATA: Displacement of cardiac device. EXAM: PORTABLE CHEST - 1 VIEW COMPARISON: 08/26/2014 at 2327 hours FINDINGS: A temporary pacer wire from below projects over the lower right atrium, near the inferior cavoatrial junction. Stable cardiomegaly. Stable aortic tortuosity. No failure. No effusion or pneumothorax. IMPRESSION: 1. Temporary pacer wire is near the inferior cavoatrial junction. 2. Cardiomegaly without failure. Electronically Signed By: Marnee Spring M.D. On: 08/27/2014 02:58   Portable Chest X-ray 1 View 08/27/2014 CLINICAL DATA: Follow up cardiac device lead position. Subsequent encounter. EXAM: PORTABLE CHEST - 1 VIEW COMPARISON: Chest radiograph performed earlier today at 4:01 p.m. FINDINGS: A metallic device is noted overlying the mediastinum, with associated leads overlying the mediastinum. One of the leads appears to have been repositioned since the prior study. A lead is seen ending overlying the expected location of the right ventricle. Vascular congestion is noted. An external pacing pad is seen on the left. No pleural effusion or pneumothorax is seen. The cardiomediastinal silhouette is enlarged. No acute osseous abnormalities are identified. IMPRESSION: 1. Lead noted ending overlying the expected location of the right ventricle. 2. At congestion and cardiomegaly; lungs remain grossly clear. Electronically Signed By: Roanna Raider M.D. On: 08/27/2014 01:06   Dg Chest Port 1 View 08/27/2014 CLINICAL DATA: Shortness of breath EXAM: PORTABLE CHEST - 1 VIEW COMPARISON: 08/26/2014 FINDINGS: Cardiomegaly and aortic tortuosity which is stable from priors. With improved inflation previously noted pulmonary venous congestion is now not apparent. There is no edema, consolidation, effusion, or pneumothorax. Previously noted pacer wire is not visible. IMPRESSION: 1. Previously noted pacer wire is not  visible, possibly due to underpenetration. 2. Cardiomegaly without failure. Electronically Signed By: Marnee Spring M.D. On: 08/27/2014 00:05   Dg Chest Port 1 View 08/26/2014 CLINICAL DATA: Acute chest pain. EXAM: PORTABLE CHEST - 1 VIEW COMPARISON: None. FINDINGS: Cardiomegaly noted. Mild pulmonary vascular congestion is identified. A defibrillator pad overlying the left chest is identified. There is no evidence of focal airspace disease, pulmonary edema, suspicious pulmonary nodule/mass, pleural effusion, or pneumothorax. No acute bony abnormalities are identified. IMPRESSION: Cardiomegaly with mild pulmonary vascular congestion. Electronically Signed By: Harmon Pier M.D. On: 08/26/2014 17:09   FOLLOW UP PLANS AND APPOINTMENTS Allergies  Allergen Reactions  . Crab [Shellfish Allergy] Hives  . Pineapple     Per Husband     Medication List    STOP taking these medications        carvedilol 25 MG tablet  Commonly known as:  COREG     triamterene-hydrochlorothiazide 37.5-25 MG per capsule  Commonly known as:  DYAZIDE     verapamil 240 MG (CO) 24 hr tablet  Commonly known as:  COVERA HS      TAKE these medications        hydrALAZINE 50 MG tablet  Commonly known as:  APRESOLINE  Take 1 tablet (50 mg total) by mouth every 8 (eight) hours.     lisinopril 10 MG tablet  Commonly known as:  PRINIVIL,ZESTRIL  Take 1 tablet (10 mg total) by mouth daily.     spironolactone-hydrochlorothiazide 25-25 MG per tablet  Commonly known as:  ALDACTAZIDE  Take 1 tablet by mouth daily.        Discharge Instructions  Diet - low sodium heart healthy    Complete by:  As directed      Increase activity slowly    Complete by:  As directed           Follow-up Information    Follow up with [facility] On 08/29/2014.   Why:  Come in at 4:15 pm to get monitor applied.   Contact information:   [address] [place] [phone]        Follow up with Sherryl MangesSteven Klein, MD.   Specialty:  Cardiology   Why:  The office will call with an appointment. See him in about 5 weeks to follow-up the event monitor.   Contact information:   [address] [place] [address] [phone]       BRING ALL MEDICATIONS WITH YOU TO FOLLOW UP APPOINTMENTS  Time spent with patient to include physician time: 41 min Signed: Theodore Demarkhonda Barrett, PA-C 08/28/2014, 2:15 PM Co-Sign MD

## 2014-08-28 NOTE — Care Management Note (Signed)
    Page 1 of 1   08/28/2014     12:03:50 PM CARE MANAGEMENT NOTE 08/28/2014  Patient:  Kristi Ward,Kristi Ward   Account Number:  [id number]  Date Initiated:  08/28/2014  Documentation initiated by:  Junius CreamerWELL,DEBBIE  Subjective/Objective Assessment:   adm w brady     Action/Plan:   lives w husband   Anticipated DC Date:  08/28/2014   Anticipated DC Plan:  HOME/SELF CARE         Choice offered to / List presented to:             Status of service:   Medicare Important Message given?   (If response is "NO", the following Medicare IM given date fields will be blank) Date Medicare IM given:   Medicare IM given by:   Date Additional Medicare IM given:   Additional Medicare IM given by:    Discharge Disposition:  HOME/SELF CARE  Per UR Regulation:  Reviewed for med. necessity/level of care/duration of stay  If discussed at Long Length of Stay Meetings, dates discussed:    Comments:

## 2014-08-29 ENCOUNTER — Other Ambulatory Visit: Payer: Self-pay

## 2014-08-29 DIAGNOSIS — R001 Bradycardia, unspecified: Secondary | ICD-10-CM

## 2014-08-31 ENCOUNTER — Other Ambulatory Visit: Payer: Self-pay | Admitting: *Deleted

## 2014-08-31 DIAGNOSIS — E876 Hypokalemia: Secondary | ICD-10-CM

## 2014-08-31 DIAGNOSIS — R001 Bradycardia, unspecified: Secondary | ICD-10-CM

## 2014-09-11 ENCOUNTER — Other Ambulatory Visit: Payer: Self-pay | Admitting: Physician Assistant

## 2014-09-11 DIAGNOSIS — E876 Hypokalemia: Secondary | ICD-10-CM

## 2014-09-15 ENCOUNTER — Telehealth: Payer: Self-pay | Admitting: Internal Medicine

## 2014-09-15 DIAGNOSIS — Z79899 Other long term (current) drug therapy: Secondary | ICD-10-CM

## 2014-09-15 NOTE — Telephone Encounter (Signed)
Received staff message, yesterday, from Kristi Demarkhonda Barrett, PA-C asking to arrange repeat BMET to check K+ level. Arranged for [place]. Gave [place] address to [place] office to pick up orders for [facility]. Patient verbalized understanding and agreeable to plan.

## 2014-09-15 NOTE — Telephone Encounter (Signed)
New message ° ° ° ° °Returned a nurses call °

## 2014-09-19 ENCOUNTER — Other Ambulatory Visit: Payer: Self-pay

## 2014-09-19 DIAGNOSIS — E876 Hypokalemia: Secondary | ICD-10-CM

## 2014-09-19 DIAGNOSIS — Z79899 Other long term (current) drug therapy: Secondary | ICD-10-CM

## 2014-09-20 LAB — BASIC METABOLIC PANEL
BUN: 17 mg/dL (ref 6–23)
CHLORIDE: 100 meq/L (ref 96–112)
CO2: 29 mEq/L (ref 19–32)
Calcium: 10.7 mg/dL — ABNORMAL HIGH (ref 8.4–10.5)
Creat: 0.79 mg/dL (ref 0.50–1.10)
Glucose, Bld: 98 mg/dL (ref 70–99)
POTASSIUM: 4.9 meq/L (ref 3.5–5.3)
SODIUM: 137 meq/L (ref 135–145)

## 2014-09-25 ENCOUNTER — Telehealth: Payer: Self-pay | Admitting: Internal Medicine

## 2014-09-25 MED ORDER — SPIRONOLACTONE 25 MG PO TABS: 25.0000 mg | ORAL_TABLET | Freq: Every day | ORAL | Status: DC

## 2014-09-25 NOTE — Telephone Encounter (Signed)
New message     Pt c/o medication issue:  1. Name of Medication:lisinopril 10mg /spironolactone HCTZ 25-25 and hydralazine 50  2. How are you currently taking this medication (dosage and times per day)?  3. Are you having a reaction (difficulty breathing--STAT)?no 4. What is your medication issue? Pt recently had labs drawn.  Should she get these medications refilled based on the results of her labs?

## 2014-09-25 NOTE — Telephone Encounter (Signed)
Reviewed with Dr. Graciela HusbandsKlein

## 2014-09-25 NOTE — Telephone Encounter (Signed)
Reviewed with Dr. Graciela HusbandsKlein - advised to stop Aldactazide, start Spironolactone. [facility]  Patient has office with Dr. Graciela HusbandsKlein on 4/20, so we discussed how we could repeat lab when she comes to see us then.  Patient verbalized understanding and agreeable to plan.

## 2014-10-03 ENCOUNTER — Encounter: Payer: Self-pay | Admitting: Internal Medicine

## 2014-10-12 ENCOUNTER — Encounter: Payer: Self-pay | Admitting: Internal Medicine

## 2014-10-12 ENCOUNTER — Ambulatory Visit (INDEPENDENT_AMBULATORY_CARE_PROVIDER_SITE_OTHER): Payer: Medicare Other | Admitting: Internal Medicine

## 2014-10-12 VITALS — BP 172/76 | HR 74 | Ht 61.0 in | Wt 194.6 lb

## 2014-10-12 DIAGNOSIS — G478 Other sleep disorders: Secondary | ICD-10-CM | POA: Diagnosis not present

## 2014-10-12 DIAGNOSIS — R001 Bradycardia, unspecified: Secondary | ICD-10-CM | POA: Diagnosis not present

## 2014-10-12 DIAGNOSIS — G4730 Sleep apnea, unspecified: Secondary | ICD-10-CM

## 2014-10-12 LAB — BASIC METABOLIC PANEL
BUN: 17 mg/dL (ref 6–23)
CALCIUM: 9.9 mg/dL (ref 8.4–10.5)
CO2: 27 mEq/L (ref 19–32)
Chloride: 103 mEq/L (ref 96–112)
Creatinine, Ser: 0.81 mg/dL (ref 0.40–1.20)
GFR: 89.56 mL/min (ref >60.00)
Glucose, Bld: 101 mg/dL — ABNORMAL HIGH (ref 70–99)
POTASSIUM: 4 meq/L (ref 3.5–5.1)
SODIUM: 136 meq/L (ref 135–145)

## 2014-10-12 MED ORDER — HYDRALAZINE HCL 50 MG PO TABS: 75.0000 mg | ORAL_TABLET | Freq: Three times a day (TID) | ORAL | Status: DC

## 2014-10-12 MED ORDER — SPIRONOLACTONE 25 MG PO TABS: 25.0000 mg | ORAL_TABLET | Freq: Every day | ORAL | Status: DC

## 2014-10-12 MED ORDER — LOSARTAN POTASSIUM 100 MG PO TABS: 100.0000 mg | ORAL_TABLET | Freq: Every day | ORAL | Status: DC

## 2014-10-12 NOTE — Patient Instructions (Signed)
Medication Instructions:  Your physician has recommended you make the following change in your medication:  1) STOP Lisinopril 2) START Losartan 100 mg daily 3) INCREASE Hydralazine to 75 mg every 8 hours   Labwork: BMET today  Testing/Procedures: Your physician has recommended that you have a sleep study. This test records several body functions during sleep, including: brain activity, eye movement, oxygen and carbon dioxide blood levels, heart rate and rhythm, breathing rate and rhythm, the flow of air through your mouth and nose, snoring, body muscle movements, and chest and belly movement.   Follow-Up: Your physician recommends that you schedule a follow-up appointment in: 3 months with Dr. Graciela HusbandsKlein.   Thank you for choosing  [facility]!!

## 2014-10-12 NOTE — Progress Notes (Signed)
      Patient Care Team: Ignatius Speckinghruv B. Vyas, MD as PCP - General (Internal Medicine)   HPI  Kristi Ward is a 74 y.o. female seen in follow-up for symptomatic sinus bradycardia at resolved following discontinuation of calcium blockers and beta blockers. She also has a history of hypertension and medications were changed to include hydralazine Aldactone and lisinopril. Hospital evaluation included an echocardiogram demonstrating normal left ventricular function with mild left atrial enlargement     Past Medical History  Diagnosis Date  . Hypertension     Past Surgical History  Procedure Laterality Date  . Abdominal hysterectomy      Current Outpatient Prescriptions  Medication Sig Dispense Refill  . hydrALAZINE (APRESOLINE) 50 MG tablet Take 1 tablet (50 mg total) by mouth every 8 (eight) hours. 90 tablet 1  . lisinopril (PRINIVIL,ZESTRIL) 10 MG tablet Take 1 tablet (10 mg total) by mouth daily. 30 tablet 1  . spironolactone (ALDACTONE) 25 MG tablet Take 1 tablet (25 mg total) by mouth daily. 30 tablet 3   No current facility-administered medications for this visit.    Allergies  Allergen Reactions  . Crab [Shellfish Allergy] Hives  . Pineapple     Per Husband, upset stomach    Review of Systems negative except from HPI and PMH  Physical Exam BP 172/76 mmHg  Pulse 74  Ht 5\' 1"  (1.549 m)  Wt 194 lb 9.6 oz (88.27 kg)  BMI 36.79 kg/m2 Well developed and well nourished in no acute distress HENT normal E scleral and icterus clear Neck Supple JVP flat; carotids brisk and full Clear to ausculation  Regular rate and rhythm, no murmurs gallops or rub Soft with active bowel sounds No clubbing cyanosis  Edema Alert and oriented, grossly normal motor and sensory function Skin Warm and Dry  t   Assessment and  Plan   sinus bradycardia   Hypertension   Sleep disordered breathing   The patient's telemetry and 30 day monitor showing showed no significant bradycardia  following the discontinuation of calcium blockers and beta blockers. I've told her that there is about a 50% likelihood that she will still need pacing.  She has poorly controlled high blood pressure. We will increase her hydralazine from 50--75 mg. She also has a cough related to her ACE inhibitor and we will discontinue it and begin her on losartan. She also takes Aldactone which is a high risk medication she needs potassium surveillance every 3 months. We'll check it today.  In addition, she has obstructive sleep apnea. She is sleep disordered breathing and daytime somnolence. This may well be contributing to her blood pressure and will arrange for sleep study.   We spent more than 50% of our >45 min visit in face to face counseling regarding the above

## 2014-10-31 ENCOUNTER — Telehealth: Payer: Self-pay | Admitting: Internal Medicine

## 2014-10-31 NOTE — Telephone Encounter (Signed)
Pt states has been having SOB when talking and with activity for the last 3 to 4 days. Pt states that in March 5 th she fainted and was taken to ER then . Pt had a F/U visit with Dr. Graciela HusbandsKlein on April 21 st her HR then was 74 beats.minute. Pt said one medication was changed and another was increased on that O/V. Pt thinks that her HR may be low. Pt would like to know if she needs to come here or go to a primary care clinic.

## 2014-10-31 NOTE — Telephone Encounter (Signed)
Pt c/o Shortness Of Breath: STAT if SOB developed within the last 24 hours or pt is noticeably SOB on the phone  1. Are you currently SOB (can you hear that pt is SOB on the phone)? yes  2. How long have you been experiencing SOB? Last few days  3. Are you SOB when sitting or when up moving around? Moving around  4. Are you currently experiencing any other symptoms? no

## 2014-11-01 NOTE — Telephone Encounter (Signed)
lmtcb  (will offer flex schedule follow up for tomorrow)

## 2014-11-01 NOTE — Telephone Encounter (Signed)
Late entry: Spoke with patient yesterday  who tells me the for past 2-4 days she has been tiring more easily, winded when talking, sluggish and just doesn't feel well.  Advised that I would review with Dr. Graciela HusbandsKlein.

## 2014-11-08 NOTE — Telephone Encounter (Signed)
Left another message to see how patient was and why she did not return my call

## 2014-11-09 NOTE — Telephone Encounter (Signed)
Patient tells me that she is feeling better. She thinks she was just run down secondary to a couple of family deaths and a lot of running around. She will call if symptoms return.

## 2014-11-09 NOTE — Telephone Encounter (Signed)
Follow up  ° ° ° °Returning call back to nurse  °

## 2014-11-16 ENCOUNTER — Encounter (HOSPITAL_BASED_OUTPATIENT_CLINIC_OR_DEPARTMENT_OTHER): Payer: Medicare Other

## 2015-01-18 ENCOUNTER — Encounter: Payer: Self-pay | Admitting: Internal Medicine

## 2015-01-18 ENCOUNTER — Ambulatory Visit (INDEPENDENT_AMBULATORY_CARE_PROVIDER_SITE_OTHER): Payer: Medicare Other | Admitting: Internal Medicine

## 2015-01-18 VITALS — BP 192/98 | HR 91 | Ht 60.5 in | Wt 192.0 lb

## 2015-01-18 DIAGNOSIS — G478 Other sleep disorders: Secondary | ICD-10-CM | POA: Diagnosis not present

## 2015-01-18 DIAGNOSIS — Z79899 Other long term (current) drug therapy: Secondary | ICD-10-CM

## 2015-01-18 DIAGNOSIS — G4730 Sleep apnea, unspecified: Secondary | ICD-10-CM

## 2015-01-18 DIAGNOSIS — R001 Bradycardia, unspecified: Secondary | ICD-10-CM

## 2015-01-18 LAB — BASIC METABOLIC PANEL
BUN: 16 mg/dL (ref 6–23)
CALCIUM: 9.9 mg/dL (ref 8.4–10.5)
CHLORIDE: 102 meq/L (ref 96–112)
CO2: 30 mEq/L (ref 19–32)
Creatinine, Ser: 0.81 mg/dL (ref 0.40–1.20)
GFR: 89.49 mL/min (ref >60.00)
Glucose, Bld: 101 mg/dL — ABNORMAL HIGH (ref 70–99)
POTASSIUM: 4 meq/L (ref 3.5–5.1)
Sodium: 137 mEq/L (ref 135–145)

## 2015-01-18 MED ORDER — HYDRALAZINE HCL 100 MG PO TABS: 100.0000 mg | ORAL_TABLET | Freq: Three times a day (TID) | ORAL | Status: DC

## 2015-01-18 MED ORDER — HYDROCHLOROTHIAZIDE 25 MG PO TABS: 25.0000 mg | ORAL_TABLET | Freq: Every day | ORAL | Status: DC

## 2015-01-18 MED ORDER — CARVEDILOL 6.25 MG PO TABS: 6.2500 mg | ORAL_TABLET | Freq: Two times a day (BID) | ORAL | Status: DC

## 2015-01-18 NOTE — Patient Instructions (Addendum)
Medication Instructions:  Your physician has recommended you make the following change in your medication:  1) INCREASE Hydralazine to 100 mg every eight hours 2) START Hydrochlorothiazide daily 3) START Carvedilol 6.25 mg twice a day  Labwork: Today: BMET  Testing/Procedures: Your physician has recommended that you have a sleep study. This test records several body functions during sleep, including: brain activity, eye movement, oxygen and carbon dioxide blood levels, heart rate and rhythm, breathing rate and rhythm, the flow of air through your mouth and nose, snoring, body muscle movements, and chest and belly movement.  Follow-Up: Your physician recommends that you schedule a follow-up appointment in: 2 weeks with PA/NP.  Your physician recommends that you schedule a follow-up appointment in: 2 months with Dr. Graciela Husbands.  Any Other Special Instructions Will Be Listed Below (If Applicable). Please obtain a blood pressure machine so that you may track your heart rate  Thank you for choosing  [facility]!!

## 2015-01-18 NOTE — Progress Notes (Signed)
      Patient Care Team: Ignatius Specking, MD as PCP - General (Internal Medicine)   HPI  Kristi Ward is a 74 y.o. female seen in follow-up for symptomatic sinus bradycardia.    It resolved following discontinuation of calcium blockers and beta blockers. She also has a history of hypertension and medications were changed to include hydralazine Aldactone and lisinopril.   The patient denies chest pain, shortness of breath, nocturnal dyspnea, orthopnea or peripheral edema.  There have been no palpitations, lightheadedness or syncope.    Echo 3/16  nrmnal LV function    Past Medical History  Diagnosis Date  . Hypertension     Past Surgical History  Procedure Laterality Date  . Abdominal hysterectomy      Current Outpatient Prescriptions  Medication Sig Dispense Refill  . hydrALAZINE (APRESOLINE) 50 MG tablet Take 1.5 tablets (75 mg total) by mouth every 8 (eight) hours. 135 tablet 3  . losartan (COZAAR) 100 MG tablet Take 1 tablet (100 mg total) by mouth daily. 30 tablet 3  . spironolactone (ALDACTONE) 25 MG tablet Take 1 tablet (25 mg total) by mouth daily. 90 tablet 1   No current facility-administered medications for this visit.    Allergies  Allergen Reactions  . Crab [Shellfish Allergy] Hives  . Pineapple     Per Husband, upset stomach    Review of Systems negative except from HPI and PMH  Physical Exam BP 192/98 mmHg  Pulse 91  Ht 5' 0.5" (1.537 m)  Wt 192 lb (87.091 kg)  BMI 36.87 kg/m2 Well developed and well nourished in no acute distress HENT normal E scleral and icterus clear Neck Supple JVP flat; carotids brisk and full Clear to ausculation  Regular rate and rhythm, no murmurs gallops or rub Soft with active bowel sounds No clubbing cyanosis  Edema Alert and oriented, grossly normal motor and sensory function Skin Warm and Dry  t   Assessment and  Plan   sinus bradycardia   Hypertension poorly controlled  Sleep disordered breathing      Will increase hydralazine to 100 tid Add back carvedilol at 6.25 Needs BP monitor at home to track BP and HR Would get sleep study for refracory hypertension Est with SK in [place] 2 mo  Add HCTZ (25) to Losartan Need to followup with PCP   We spent more than 50% of our >26min visit in face to face counseling regarding the above

## 2015-02-20 NOTE — Progress Notes (Signed)
Cardiology Office Note   Date:  02/21/2015   ID:  Juletta, Berhe 04/21/1941, MRN [id number]  PCP:  Ignatius Specking., MD  Cardiologist:  Dr. Sherryl Manges     Chief Complaint  Patient presents with  . Follow-up    HTN     History of Present Illness: Michal Strzelecki is a 74 y.o. female with a hx of HTN, symptomatic bradycardia.  Admitted in 3/16 syncope in the setting of extreme bradycardia (heart rate 24). Heart rate recovered with discontinuation of beta blocker and calcium channel blocker. She did not require pacemaker implantation. Records indicate she had a follow-up 30 day event monitor that demonstrated no significant bradycardia arrhythmia (I cannot find the report) since. She has apparently been set up for a sleep study. I do not see that this has been performed yet. Last seen by Dr. Graciela Husbands in 7/16. Blood pressure was markedly elevated. Hydralazine was adjusted. She was placed back on carvedilol. HCTZ was added. She returns for follow-up.   Studies/Reports Reviewed Today:  Echo 3/16 Technically difficult, mild LVH, EF 60-65%, normal wall motion, grade 1 diastolic dysfunction, high ventricular filling pressure, calcified aortic valve annulus, mild LAE (33.75ml/m^2).    Past Medical History  Diagnosis Date  . Hypertension   . Bradycardia     a. admx 3/16 with syncope and HR 24 >> HR recovered with DC of beta blocker, CCB    Past Surgical History  Procedure Laterality Date  . Abdominal hysterectomy       Current Outpatient Prescriptions  Medication Sig Dispense Refill  . carvedilol (COREG) 6.25 MG tablet Take 1 tablet (6.25 mg total) by mouth 2 (two) times daily. 60 tablet 3  . hydrALAZINE (APRESOLINE) 100 MG tablet Take 1 tablet (100 mg total) by mouth every 8 (eight) hours. 90 tablet 2  . hydrochlorothiazide (HYDRODIURIL) 25 MG tablet Take 1 tablet (25 mg total) by mouth daily. 30 tablet 2  . losartan (COZAAR) 100 MG tablet Take 1 tablet (100 mg total) by mouth  daily. 30 tablet 3  . spironolactone (ALDACTONE) 25 MG tablet Take 1 tablet (25 mg total) by mouth daily. 90 tablet 1   No current facility-administered medications for this visit.    Allergies:   Crab and Pineapple    Social History:  The patient  reports that she has never smoked. She does not have any smokeless tobacco history on file. She reports that she does not drink alcohol or use illicit drugs.   Family History:  The patient's family history includes Cancer - Prostate in her father; Stroke in her mother.    ROS:   Please see the history of present illness.   ROS    PHYSICAL EXAM: VS:  There were no vitals taken for this visit.    Wt Readings from Last 3 Encounters:  01/18/15 192 lb (87.091 kg)  10/12/14 194 lb 9.6 oz (88.27 kg)  08/26/14 205 lb 14.6 oz (93.4 kg)     GEN: Well nourished, well developed, in no acute distress HEENT: normal Neck: no JVD, no carotid bruits, no masses Cardiac:  Normal S1/S2, RRR; no murmur ,  no rubs or gallops, no edema  Respiratory:  clear to auscultation bilaterally, no wheezing, rhonchi or rales. GI: soft, nontender, nondistended, + BS MS: no deformity or atrophy Skin: warm and dry  Neuro:  CNs II-XII intact, Strength and sensation are intact Psych: Normal affect   EKG:  EKG is ordered today.  It demonstrates:  Recent Labs: 08/26/2014: TSH 2.033 08/28/2014: Hemoglobin 11.2*; Platelets 378 01/18/2015: BUN 16; Creatinine, Ser 0.81; Potassium 4.0; Sodium 137    Lipid Panel No results found for: CHOL, TRIG, HDL, CHOLHDL, VLDL, LDLCALC, LDLDIRECT    ASSESSMENT AND PLAN:  Essential hypertension  Symptomatic bradycardia  Sleep-disordered breathing     Medication Changes: Current medicines are reviewed at length with the patient today.  Concerns regarding medicines are as outlined above.  The following changes have been made:   Discontinued Medications   No medications on file   Modified Medications   No medications  on file   New Prescriptions   No medications on file    Labs/ tests ordered today include:   No orders of the defined types were placed in this encounter.      Disposition:    FU with    Signed, Tereso Newcomer, PA-C, MHS 02/21/2015 8:33 AM    [facility] [address] [place], [place], [place]  [address] Phone: [phone]; Fax: [phone]    This encounter was created in error - please disregard.

## 2015-02-21 ENCOUNTER — Encounter: Payer: Self-pay | Admitting: Physician Assistant

## 2015-02-21 ENCOUNTER — Encounter: Payer: Medicare Other | Admitting: Physician Assistant

## 2015-02-28 ENCOUNTER — Encounter: Payer: Self-pay | Admitting: Physician Assistant

## 2015-04-04 ENCOUNTER — Encounter: Payer: Self-pay | Admitting: *Deleted

## 2015-04-05 ENCOUNTER — Ambulatory Visit (INDEPENDENT_AMBULATORY_CARE_PROVIDER_SITE_OTHER): Payer: Medicare Other | Admitting: Internal Medicine

## 2015-04-05 ENCOUNTER — Encounter: Payer: Self-pay | Admitting: Internal Medicine

## 2015-04-05 VITALS — BP 140/84 | HR 81 | Ht 61.0 in | Wt 191.8 lb

## 2015-04-05 DIAGNOSIS — R001 Bradycardia, unspecified: Secondary | ICD-10-CM | POA: Diagnosis not present

## 2015-04-05 LAB — BASIC METABOLIC PANEL
BUN: 16 mg/dL (ref 7–25)
CALCIUM: 10.1 mg/dL (ref 8.6–10.4)
CO2: 29 mmol/L (ref 20–31)
Chloride: 100 mmol/L (ref 98–110)
Creat: 0.78 mg/dL (ref 0.60–0.93)
Glucose, Bld: 107 mg/dL — ABNORMAL HIGH (ref 65–99)
Potassium: 3.6 mmol/L (ref 3.5–5.3)
Sodium: 137 mmol/L (ref 135–146)

## 2015-04-05 MED ORDER — CARVEDILOL 6.25 MG PO TABS: ORAL_TABLET | ORAL | Status: DC

## 2015-04-05 MED ORDER — HYDROCHLOROTHIAZIDE 25 MG PO TABS: 25.0000 mg | ORAL_TABLET | Freq: Every day | ORAL | Status: DC

## 2015-04-05 MED ORDER — HYDRALAZINE HCL 100 MG PO TABS: 100.0000 mg | ORAL_TABLET | Freq: Two times a day (BID) | ORAL | Status: DC

## 2015-04-05 NOTE — Progress Notes (Signed)
      Patient Care Team: Ignatius Speckinghruv B. Vyas, MD as PCP - General (Internal Medicine)   HPI  Kristi Ward is a 74 y.o. female seen in follow-up for symptomatic sinus bradycardia.    It resolved following discontinuation of calcium blockers and beta blockers. She also has a history of hypertension and medications were changed to include hydralazine and more recently carvedilol. She is not aware of having taken lisinopril and Aldactone which are outlined in her past chart.  The patient denies chest pain, shortness of breath, nocturnal dyspnea, orthopnea or peripheral edema.  There have been no palpitations, lightheadedness or syncope.    Echo 3/16 normal LV function    Past Medical History  Diagnosis Date  . Hypertension   . Bradycardia     a. admx 3/16 with syncope and HR 24 >> HR recovered with DC of beta blocker, CCB    Past Surgical History  Procedure Laterality Date  . Abdominal hysterectomy      Current Outpatient Prescriptions  Medication Sig Dispense Refill  . carvedilol (COREG) 6.25 MG tablet Take 1 tablet (6.25 mg total) by mouth 2 (two) times daily. 60 tablet 3  . hydrALAZINE (APRESOLINE) 100 MG tablet Take 1 tablet (100 mg total) by mouth every 8 (eight) hours. 90 tablet 2  . hydrochlorothiazide (HYDRODIURIL) 25 MG tablet Take 1 tablet (25 mg total) by mouth daily. 30 tablet 2   No current facility-administered medications for this visit.    Allergies  Allergen Reactions  . Crab [Shellfish Allergy] Hives  . Pineapple Other (See Comments)    Per Husband, upset stomach    Review of Systems negative except from HPI and PMH  Physical Exam BP 140/84 mmHg  Pulse 81  Ht 5\' 1"  (1.549 m)  Wt 191 lb 12.8 oz (87 kg)  BMI 36.26 kg/m2 Well developed and well nourished in no acute distress HENT normal E scleral and icterus clear Neck Supple JVP flat; carotids brisk and full Clear to ausculation  Regular rate and rhythm, no murmurs gallops or rub Soft with  active bowel sounds No clubbing cyanosis  Edema Alert and oriented, grossly normal motor and sensory function Skin Warm and Dry  t   Assessment and  Plan   sinus bradycardia   Hypertension poorly controlled  Sleep disordered breathing     She finds hydralazine 3 times a day challenging; we will reduce to 200 twice a day and increase her carvedilol from 6.25--9.375  she will track her blood pressures and heart rates in home and let us know.  She is on HCTZ but has not had a metabolic profile recently. We will check it today. We'll see her again in one year's time.

## 2015-04-05 NOTE — Patient Instructions (Signed)
Medication Instructions: 1) Increase coreg (carvedilol) to 6.25 mg take 1 & 1/2 tablets (9.375 mg) by mouth twice daily 2) Decrease hydralazine to 100 mg one tablet by mouth twice daily  Labwork: - Your physician recommends that you have lab work today: [employer]  Procedures/Testing: - none  Follow-Up: - Your physician wants you to follow-up in: 1 year with Dr. Graciela HusbandsKlein. You will receive a reminder letter in the mail two months in advance. If you don't receive a letter, please call our office to schedule the follow-up appointment.  Any Additional Special Instructions Will Be Listed Below (If Applicable).

## 2015-04-09 ENCOUNTER — Encounter (HOSPITAL_BASED_OUTPATIENT_CLINIC_OR_DEPARTMENT_OTHER): Payer: Medicare Other

## 2015-04-16 ENCOUNTER — Ambulatory Visit: Payer: Medicare Other | Admitting: Internal Medicine

## 2015-06-21 ENCOUNTER — Telehealth: Payer: Self-pay | Admitting: Internal Medicine

## 2015-06-21 NOTE — Telephone Encounter (Signed)
Fax received from [facility] stating that Dr. Graciela HusbandsKlein had prescribed her HCTZ 25 mg daily and Dr. Doreen Beamhruv Vyas had prescribed her triameterene/hctz 37.5/25 mg daily. They wanted to clarify if she should be taking both. I left a message for the patient to call to see if she is on both meds. She was not taking the triameterene/hctz when Dr. Graciela HusbandsKlein saw her in 04/05/15 only the hctz 25 mg once daily.

## 2015-06-22 MED ORDER — HYDROCHLOROTHIAZIDE 25 MG PO TABS: 25.0000 mg | ORAL_TABLET | Freq: Every day | ORAL | Status: DC

## 2015-06-22 NOTE — Telephone Encounter (Signed)
F/u ° ° °Pt returning call from nurse. °

## 2015-06-22 NOTE — Telephone Encounter (Signed)
Called back and stated she is only taking what Dr. Graciela HusbandsKlein has prescribed.  The HCTZ 25 mg, the Hydralazine and Coreg.  [facility]

## 2015-11-21 ENCOUNTER — Other Ambulatory Visit: Payer: Self-pay | Admitting: Internal Medicine

## 2015-12-03 ENCOUNTER — Telehealth: Payer: Self-pay | Admitting: Internal Medicine

## 2015-12-03 NOTE — Telephone Encounter (Signed)
Patient c/o Palpitations:  High priority if patient c/o lightheadedness and shortness of breath.  1. How long have you been having palpitations? Last couple of days  2. Are you currently experiencing lightheadedness and shortness of breath?lightheaded after exertion   3. Have you checked your BP and heart rate? (document readings) HR- 46- stated it would go up and down-   4. Are you experiencing any other symptoms? no

## 2015-12-03 NOTE — Telephone Encounter (Signed)
I called and spoke with the patient. She states for the last 2 weeks, she has had slow and fast HR's flucuating from the mid 40's- 103.  She does not feel like her heart is skipping beats/ palpating, but she does have intermittent lightheadedness/ "sluggishness." She checks her HR more than her BP, per her report. BP checks are typically just done at the pharmacy and she runs 150-160 systolic. She was taking coreg 6.25 mg 1 & 1/2 tablets BID until about 2 months ago, but decreased this back to 1 tablet (6.25 mg) BID due to low HR's. I advised her I will need to review with Dr. Graciela HusbandsKlein and call her back. She is agreeable.

## 2015-12-04 ENCOUNTER — Ambulatory Visit: Payer: Medicare Other | Admitting: *Deleted

## 2015-12-04 VITALS — BP 156/96 | HR 83 | Ht 61.0 in | Wt 194.5 lb

## 2015-12-04 DIAGNOSIS — R001 Bradycardia, unspecified: Secondary | ICD-10-CM

## 2015-12-04 NOTE — Telephone Encounter (Signed)
I spoke with the patient.  She is agreeable to coming today at 2pm for an EKG. She is aware that a nurse will do this and Dr. Graciela HusbandsKlein will review before she leaves.

## 2015-12-04 NOTE — Progress Notes (Signed)
1.) Reason for visit:    Copied from phone note earlier today:  She states for the last 2 weeks, she has had slow and fast HR's fluctuating from the mid 40's- 103.  She does not feel like her heart is skipping beats/ palpating, but she does have intermittent lightheadedness/ "sluggishness." She checks her HR more than her BP, per her report. BP checks are typically just done at the pharmacy and she runs 150-160 systolic. She was taking coreg 6.25 mg 1 &/2 tablets BID until about 2 months ago, but decreased this back to 1 tablet (6.25 mg) BID due to low HR's. I advised her I will need to review with Dr. Graciela HusbandsKlein and call her back. She is agreeable.         Name of MD requesting visit: Dr Graciela HusbandsKlein  ROS related to problem: fluctuating heart rates,denies palpitations/fluttering/skipping in her chest  Assessment and plan per MD:         Dr Graciela HusbandsKlein reviewed EKG done today.       Per Dr Stevie KernKlein-if patient is symptomatic with palpitations/fluttering/skipping heart beats can get 24 hour monitor, otherwise no new recommendations.        I offered to arrange 24 hour monitor for pt, pt denies symptoms of palpitations/fluttering/skipping heart beats.       Pt advised to continue current treatment plan, continue to monitor heart rate.       Pt's BP 156/96 today, pt states her BP is usually high in the doctors' office.      Pt advised to monitor BP at home and call if consistently above 140/90.      Pt verbalized understanding, appreciated our help today.  I did compare pt's pulse oximetry heart rate with our EKG machine heart rate, the readings were similar.

## 2015-12-04 NOTE — Telephone Encounter (Signed)
Lets have her come in for ECG   If it is not diagnostic, then lets do 48hr holter plz

## 2016-03-03 ENCOUNTER — Other Ambulatory Visit: Payer: Self-pay

## 2016-03-03 DIAGNOSIS — R001 Bradycardia, unspecified: Secondary | ICD-10-CM

## 2016-03-03 MED ORDER — CARVEDILOL 6.25 MG PO TABS: ORAL_TABLET | ORAL | 0 refills | Status: DC

## 2016-03-03 MED ORDER — HYDROCHLOROTHIAZIDE 25 MG PO TABS: 25.0000 mg | ORAL_TABLET | Freq: Every day | ORAL | 0 refills | Status: DC

## 2016-03-03 MED ORDER — HYDRALAZINE HCL 100 MG PO TABS: 100.0000 mg | ORAL_TABLET | Freq: Two times a day (BID) | ORAL | 0 refills | Status: DC

## 2016-04-10 ENCOUNTER — Encounter: Payer: Self-pay | Admitting: Internal Medicine

## 2016-04-24 ENCOUNTER — Ambulatory Visit (INDEPENDENT_AMBULATORY_CARE_PROVIDER_SITE_OTHER): Payer: Medicare Other | Admitting: Internal Medicine

## 2016-04-24 ENCOUNTER — Encounter: Payer: Self-pay | Admitting: Internal Medicine

## 2016-04-24 VITALS — BP 162/90 | HR 73 | Ht 60.0 in | Wt 193.8 lb

## 2016-04-24 DIAGNOSIS — R001 Bradycardia, unspecified: Secondary | ICD-10-CM | POA: Diagnosis not present

## 2016-04-24 MED ORDER — CARVEDILOL 12.5 MG PO TABS: ORAL_TABLET | ORAL | 3 refills | Status: DC

## 2016-04-24 MED ORDER — HYDRALAZINE HCL 100 MG PO TABS: 100.0000 mg | ORAL_TABLET | Freq: Two times a day (BID) | ORAL | 3 refills | Status: DC

## 2016-04-24 MED ORDER — HYDROCHLOROTHIAZIDE 25 MG PO TABS: 25.0000 mg | ORAL_TABLET | Freq: Every day | ORAL | 3 refills | Status: DC

## 2016-04-24 NOTE — Patient Instructions (Addendum)
Your physician has recommended you make the following change in your medication:  INCREASE CARVEDILOL TO 12.5 MG  TWICE  DAILY   START  AMLODIPINE  2.5 MG  EVERY  DAY   Your physician wants you to follow-up in: YEAR WITH DR   Logan BoresKLEIN  You will receive a reminder letter in the mail two months in advance. If you don't receive a letter, please call our office to schedule the follow-up appointment.

## 2016-04-24 NOTE — Progress Notes (Signed)
      Patient Care Team: Ignatius Speckinghruv B Vyas, MD as PCP - General (Internal Medicine)   HPI  Kristi Ward is a 75 y.o. female seen in follow-up for symptomatic sinus bradycardia.    It resolved following discontinuation of calcium blockers and beta blockers. She also has a history of hypertension and medications were changed to include hydralazine and more recently carvedilol. She is not aware of having taken lisinopril and Aldactone which are outlined in her past chart.  The patient denies chest pain,  nocturnal dyspnea, orthopnea or peripheral edema.  There have been no palpitations, lightheadedness or syncope.    Echo 3/16 normal LV function surprisingly E/E' was normal there is mild diastolic dysfunction  She has new dyspnea on exertion. She does not have peripheral edema nocturnal dyspnea. She denies chest pain.    Past Medical History:  Diagnosis Date  . Bradycardia    a. admx 3/16 with syncope and HR 24 >> HR recovered with DC of beta blocker, CCB  . Hypertension     Past Surgical History:  Procedure Laterality Date  . ABDOMINAL HYSTERECTOMY      Current Outpatient Prescriptions  Medication Sig Dispense Refill  . carvedilol (COREG) 6.25 MG tablet 1 tablet by mouth in the AM and 1.5 tablets by mouth in the PM 225 tablet 0  . hydrALAZINE (APRESOLINE) 100 MG tablet Take 1 tablet (100 mg total) by mouth 2 (two) times daily. Please keep 04/24/16 appt for further refills. 180 tablet 0  . hydrochlorothiazide (HYDRODIURIL) 25 MG tablet Take 1 tablet (25 mg total) by mouth daily. Please keep 04/24/16 appt for further refills. 90 tablet 0   No current facility-administered medications for this visit.     Allergies  Allergen Reactions  . Crab [Shellfish Allergy] Hives  . Pineapple Other (See Comments)    Per Husband, upset stomach    Review of Systems negative except from HPI and PMH  Physical Exam BP (!) 162/90   Pulse 73   Ht 5' (1.524 m)   Wt 193 lb 12.8 oz (87.9 kg)    SpO2 98%   BMI 37.85 kg/m  Well developed and well nourished in no acute distress HENT normal E scleral and icterus clear Neck Supple JVP flat; carotids brisk and full Clear to ausculation  Regular rate and rhythm, no murmurs gallops or rub Soft with active bowel sounds No clubbing cyanosis  Edema Alert and oriented, grossly normal motor and sensory function Skin Warm and Dry   ECG demonstrates sinus rhythm at 63 Intervals 18/08/38 Otherwise normal  Assessment and  Plan   sinus bradycardia   Hypertension poorly controlled     She is on HCTZ but has not had a metabolic profile recently I've asked her to follow-up with her PCP in the next couple of weeks.  Her bradycardia has not been an issue so we are going to increase her carvedilol gently to 12.5 twice daily. We will also add amlodipine 2.5 mg a day. We will plan to see her next year   We spent more than 50% of our >25 min visit in face to face counseling regarding the above

## 2017-01-23 ENCOUNTER — Other Ambulatory Visit: Payer: Self-pay | Admitting: Internal Medicine

## 2017-01-23 DIAGNOSIS — R001 Bradycardia, unspecified: Secondary | ICD-10-CM

## 2017-04-09 ENCOUNTER — Other Ambulatory Visit: Payer: Self-pay | Admitting: Internal Medicine

## 2017-04-09 DIAGNOSIS — R001 Bradycardia, unspecified: Secondary | ICD-10-CM

## 2017-05-08 ENCOUNTER — Other Ambulatory Visit: Payer: Self-pay | Admitting: *Deleted

## 2017-05-08 MED ORDER — HYDROCHLOROTHIAZIDE 25 MG PO TABS: 25.0000 mg | ORAL_TABLET | Freq: Every day | ORAL | 0 refills | Status: DC

## 2017-06-26 ENCOUNTER — Other Ambulatory Visit: Payer: Self-pay | Admitting: Internal Medicine

## 2017-07-04 ENCOUNTER — Other Ambulatory Visit: Payer: Self-pay | Admitting: Internal Medicine

## 2017-07-04 DIAGNOSIS — R001 Bradycardia, unspecified: Secondary | ICD-10-CM

## 2017-07-07 ENCOUNTER — Encounter: Payer: Self-pay | Admitting: Internal Medicine

## 2017-07-23 ENCOUNTER — Encounter: Payer: Self-pay | Admitting: Internal Medicine

## 2017-07-23 ENCOUNTER — Ambulatory Visit: Payer: Medicare Other | Admitting: Internal Medicine

## 2017-07-23 DIAGNOSIS — R001 Bradycardia, unspecified: Secondary | ICD-10-CM | POA: Diagnosis not present

## 2017-07-23 MED ORDER — HYDRALAZINE HCL 100 MG PO TABS: 100.0000 mg | ORAL_TABLET | Freq: Two times a day (BID) | ORAL | 11 refills | Status: DC

## 2017-07-23 MED ORDER — CARVEDILOL 12.5 MG PO TABS: 12.5000 mg | ORAL_TABLET | Freq: Two times a day (BID) | ORAL | 11 refills | Status: DC

## 2017-07-23 MED ORDER — HYDROCHLOROTHIAZIDE 25 MG PO TABS: 25.0000 mg | ORAL_TABLET | Freq: Every day | ORAL | 3 refills | Status: DC

## 2017-07-23 NOTE — Progress Notes (Signed)
      Patient Care Team: Ignatius SpeckingVyas, Dhruv B, MD as PCP - General (Internal Medicine)   HPI  Kristi Ward is a 77 y.o. female seen in follow-up for symptomatic sinus bradycardia.    It resolved following discontinuation of calcium blockers and beta blockers. She also has a history of hypertension and medications were changed to include hydralazine and more recently carvedilol. She is not aware of having taken lisinopril and Aldactone which are outlined in her past chart.  The patient denies chest pain, shortness of breath, nocturnal dyspnea, orthopnea or peripheral edema.  There have been no palpitations, lightheadedness or syncope.    Blood pressure at home is in the 140-150 range.  Reviewing her days she is largely sedentary  Echo 3/16 normal LV function surprisingly E/E' was normal there is mild diastolic dysfunction      Past Medical History:  Diagnosis Date  . Bradycardia    a. admx 3/16 with syncope and HR 24 >> HR recovered with DC of beta blocker, CCB  . Hypertension     Past Surgical History:  Procedure Laterality Date  . ABDOMINAL HYSTERECTOMY      Current Outpatient Medications  Medication Sig Dispense Refill  . carvedilol (COREG) 12.5 MG tablet Take 1 tablet (12.5 mg total) by mouth 2 (two) times daily. Please keep upcoming appt for future refills. Thank you 60 tablet 0  . hydrALAZINE (APRESOLINE) 100 MG tablet Take 1 tablet (100 mg total) by mouth 2 (two) times daily. Please keep upcoming appt for future refills. Thank you 60 tablet 0  . hydrochlorothiazide (HYDRODIURIL) 25 MG tablet Take 1 tablet (25 mg total) by mouth daily. Please keep upcoming appt in January for future refills. Thank you 90 tablet 0   No current facility-administered medications for this visit.     Allergies  Allergen Reactions  . Crab [Shellfish Allergy] Hives  . Pineapple Other (See Comments)    Per Husband, upset stomach    Review of Systems negative except from HPI and  PMH  Physical Exam BP (!) 152/88   Pulse 69   Ht 5\' 1"  (1.549 m)   Wt 193 lb (87.5 kg)   SpO2 98%   BMI 36.47 kg/m  Well developed and nourished in no acute distress HENT normal Neck supple with JVP-flat Clear Regular rate and rhythm, no murmurs or gallops Abd-soft with active BS No Clubbing cyanosis edema Skin-warm and dry A & Oriented  Grossly normal sensory and motor function  ECG demonstrates sinus @ 68 18/08/38  otherwise normal  Assessment and  Plan  Sinus bradycardia   Obesity  Hypertension poorly controlled  Overall well   Importance of exercise and weight loss for blood pressure control.  Rather than modify medications at this point, she would like to pursue the former.  We spent more than 50% of our >25 min visit in face to face counseling regarding the above

## 2017-07-23 NOTE — Patient Instructions (Addendum)

## 2017-12-15 ENCOUNTER — Other Ambulatory Visit: Payer: Self-pay | Admitting: Internal Medicine

## 2017-12-15 DIAGNOSIS — R001 Bradycardia, unspecified: Secondary | ICD-10-CM

## 2017-12-15 MED ORDER — CARVEDILOL 12.5 MG PO TABS: 12.5000 mg | ORAL_TABLET | Freq: Two times a day (BID) | ORAL | 1 refills | Status: DC

## 2017-12-15 MED ORDER — HYDRALAZINE HCL 100 MG PO TABS: 100.0000 mg | ORAL_TABLET | Freq: Two times a day (BID) | ORAL | 1 refills | Status: DC

## 2018-04-12 ENCOUNTER — Other Ambulatory Visit: Payer: Self-pay | Admitting: Internal Medicine

## 2018-04-12 DIAGNOSIS — R001 Bradycardia, unspecified: Secondary | ICD-10-CM

## 2018-04-14 ENCOUNTER — Other Ambulatory Visit: Payer: Self-pay | Admitting: Internal Medicine

## 2018-04-14 MED ORDER — HYDRALAZINE HCL 100 MG PO TABS: 100.0000 mg | ORAL_TABLET | Freq: Two times a day (BID) | ORAL | 0 refills | Status: DC

## 2018-04-14 MED ORDER — HYDROCHLOROTHIAZIDE 25 MG PO TABS: 25.0000 mg | ORAL_TABLET | Freq: Every day | ORAL | 0 refills | Status: DC

## 2018-06-02 ENCOUNTER — Other Ambulatory Visit: Payer: Self-pay | Admitting: Internal Medicine

## 2018-07-23 ENCOUNTER — Encounter: Payer: Self-pay | Admitting: Internal Medicine

## 2018-07-23 ENCOUNTER — Ambulatory Visit: Payer: Medicare Other | Admitting: Internal Medicine

## 2018-07-23 VITALS — BP 150/96 | HR 61 | Ht 61.0 in | Wt 182.8 lb

## 2018-07-23 DIAGNOSIS — R001 Bradycardia, unspecified: Secondary | ICD-10-CM

## 2018-07-23 MED ORDER — HYDROCHLOROTHIAZIDE 25 MG PO TABS: 25.0000 mg | ORAL_TABLET | Freq: Every day | ORAL | 3 refills | Status: DC

## 2018-07-23 MED ORDER — HYDRALAZINE HCL 100 MG PO TABS: 100.0000 mg | ORAL_TABLET | Freq: Two times a day (BID) | ORAL | 3 refills | Status: DC

## 2018-07-23 MED ORDER — CARVEDILOL 12.5 MG PO TABS: 12.5000 mg | ORAL_TABLET | Freq: Two times a day (BID) | ORAL | 3 refills | Status: DC

## 2018-07-23 NOTE — Patient Instructions (Signed)
Medication Instructions:  Your physician has recommended you make the following change in your medication:   1. Begin Amlodipine, 5mg  tablet, once daily.  **If your blood pressure (top number) is consistently less than 130 for two weeks 2. Decrease your carvedilol to 6.25mg , half tablet of your current supply, twice daily. **If your blood pressure (top number) continues to be consistently under 130 for two additional weeks 3. Decrease your Hydralazine to 50mg , half tablet of your current supply, twice daily.  Labwork: None ordered.  Testing/Procedures: None ordered.  Follow-Up: Your physician recommends that you schedule a follow-up appointment in:   One Year with Dr Graciela Husbands  Any Other Special Instructions Will Be Listed Below (If Applicable).     If you need a refill on your cardiac medications before your next appointment, please call your pharmacy.

## 2018-07-23 NOTE — Progress Notes (Signed)
      Patient Care Team: Ignatius SpeckingVyas, Dhruv B, MD as PCP - General (Internal Medicine)   HPI  Kristi Ward is a 78 y.o. female seen in follow-up for symptomatic sinus bradycardia.   It resolved following discontinuation of calcium blockers and beta blockers. She also has a history of hypertension and medications were changed to include hydralazine and more recently carvedilol.   Bradycardia has not been an issue.  No edema.  Minimal dyspnea on exertion.  No chest pain or palpitations.  [facility] for blood pressures over 220.  No medication adjustments have been made.  Echo 3/16 normal LV function surprisingly E/E' was normal there is mild diastolic dysfunction      Past Medical History:  Diagnosis Date  . Bradycardia    a. admx 3/16 with syncope and HR 24 >> HR recovered with DC of beta blocker, CCB  . Hypertension     Past Surgical History:  Procedure Laterality Date  . ABDOMINAL HYSTERECTOMY      Current Outpatient Medications  Medication Sig Dispense Refill  . carvedilol (COREG) 12.5 MG tablet Take 1 tablet (12.5 mg total) by mouth 2 (two) times daily. 180 tablet 3  . hydrALAZINE (APRESOLINE) 100 MG tablet Take 1 tablet (100 mg total) by mouth 2 (two) times daily. Please make appt with Dr. Graciela HusbandsKlein for January before anymore refills. 1st attempt 180 tablet 3  . hydrochlorothiazide (HYDRODIURIL) 25 MG tablet Take 1 tablet (25 mg total) by mouth daily. 90 tablet 3   No current facility-administered medications for this visit.     Allergies  Allergen Reactions  . Crab [Shellfish Allergy] Hives  . Pineapple Other (See Comments)    Per Husband, upset stomach    Review of Systems negative except from HPI and PMH  Physical Exam BP (!) 150/96   Pulse 61   Ht 5\' 1"  (1.549 m)   Wt 182 lb 12.8 oz (82.9 kg)   SpO2 96%   BMI 34.54 kg/m  Well developed and nourished in no acute distress HENT normal Neck supple with JVP-flat Clear Regular  rate and rhythm, no murmurs or gallops Abd-soft with active BS No Clubbing cyanosis edema Skin-warm and dry A & Oriented  Grossly normal sensory and motor function   ECG  Sinus @  61 18/08/40  Assessment and  Plan  Sinus bradycardia   Obesity  Hypertension poorly controlled   continues to struggle with labile blood pressure having had 3 visits to the emergency room and even for blood pressures over 220.  She has not seen her PCP in all this time.  Has not previously been exposed to amlodipine.  We will initiate that as it may give a gentle background blood pressure control.  We will begin 5 mg.  In the event that her blood pressure falls below 130 or she develops lightheadedness she will cut her carvedilol in half.  A couple weeks later if still elevated or still with symptoms of lightheadedness we will cut her hydralazine in half.   Encouraged her to followup wi PCP for more aggressive BP management   We spent more than 50% of our >25 min visit in face to face counseling regarding the above

## 2018-10-20 ENCOUNTER — Telehealth: Payer: Self-pay | Admitting: Internal Medicine

## 2018-10-20 NOTE — Telephone Encounter (Signed)
Spoke with pt who is concerned about her HR. She states it has been as low as 53bpm today. She states her heart rate fluctuates when she moves about. I advised pt this is normal. As long as she is not light headed, dizzy, or fatigued it is okay for her HR to be in the 50's. Pt then stated she was not feeling well and wondered if it was her heart rate. I advised her to hold her carvedilol for the evening to see if this helped her. She verbalized understanding and will call back with any additional needs.

## 2018-10-20 NOTE — Telephone Encounter (Signed)
STAT if HR is under 50 or over 120 (normal HR is 60-100 beats per minute)  1) What is your heart rate? 56,53,54, now 69 has been up to 80.  2) Do you have a log of your heart rate readings (document readings)? Yes   3) Do you have any other symptoms? It keeps goiing up and down.

## 2019-05-13 ENCOUNTER — Other Ambulatory Visit: Payer: Self-pay | Admitting: Internal Medicine

## 2019-05-13 DIAGNOSIS — R001 Bradycardia, unspecified: Secondary | ICD-10-CM

## 2019-06-15 ENCOUNTER — Telehealth: Payer: Self-pay | Admitting: Internal Medicine

## 2019-06-15 DIAGNOSIS — R001 Bradycardia, unspecified: Secondary | ICD-10-CM

## 2019-06-15 NOTE — Telephone Encounter (Signed)
New message    STAT if HR is under 50 or over 120 (normal HR is 60-100 beats per minute)  1) What is your heart rate? 55  2) Do you have a log of your heart rate readings (document readings)? 53-->57  3) Do you have any other symptoms? No energy

## 2019-06-15 NOTE — Telephone Encounter (Signed)
Spoke with pt, she reports the past couple of days her HR has been in the 19s.  She denies lightheaded/ dizziness but does feel "drained"   All other VS WNL, pt unable to provide her BP result but reports no recent changes in BP.  If continues to feel symptomatic, she will check HR prior to taking Carvedilol and hold dose if HR <60.  Pt v/u to report to MD if she notes having to hold dose frequently.

## 2019-06-18 NOTE — Telephone Encounter (Signed)
Good morning  This is a big change in her HR, so would ask her to come by for an ECG if possible   Thanks SK

## 2019-06-20 NOTE — Telephone Encounter (Signed)
Spoke with pt. For update on how she is doing and to schedule an ECG.  Pt reports symptoms have resolved as she is no longer feeling fatigued.  Pt reports her HR has been at her baseline in low to mid 60's with the exception of a couple of mornings where it was in the 50's so she held her morning dose of Carvedilol.  D/W her coming in for an ECG.  Pt states she cannot come in right now due to her husband being ill.  Advised pt to continue to monitor and I would reach out to MD to see if there are any other changes.

## 2019-06-21 ENCOUNTER — Ambulatory Visit
Admission: EM | Admit: 2019-06-21 | Discharge: 2019-06-21 | Disposition: A | Discharge status: Home / Self Care | Payer: Medicare Other | Attending: Emergency Medicine | Admitting: Emergency Medicine

## 2019-06-21 ENCOUNTER — Other Ambulatory Visit: Payer: Self-pay

## 2019-06-21 DIAGNOSIS — Z20828 Contact with and (suspected) exposure to other viral communicable diseases: Secondary | ICD-10-CM | POA: Diagnosis not present

## 2019-06-21 DIAGNOSIS — Z20822 Contact with and (suspected) exposure to covid-19: Secondary | ICD-10-CM

## 2019-06-21 NOTE — Discharge Instructions (Addendum)

## 2019-06-21 NOTE — ED Triage Notes (Signed)
Pt would like covid testing, no symptoms

## 2019-06-21 NOTE — ED Provider Notes (Signed)
[facility]    CSN: [id number] Arrival date & time: 06/21/19  1208      History   Chief Complaint No chief complaint on file.   HPI Kristi Ward is a 78 y.o. female.   Kristi Ward 78 years old female presented to the urgent care for Covid testing.  Denies sick exposure to COVID, flu or strep.  Denies recent travel.  Denies aggravating or alleviating symptoms.  Denies previous COVID infection.   Denies fever, chills, fatigue, nasal congestion, rhinorrhea, sore throat, cough, SOB, wheezing, chest pain, nausea, vomiting, changes in bowel or bladder habits.       Past Medical History:  Diagnosis Date  . Bradycardia    a. admx 3/16 with syncope and HR 24 >> HR recovered with DC of beta blocker, CCB  . Hypertension     Patient Active Problem List   Diagnosis Date Noted  . Symptomatic bradycardia 08/28/2014  . Hypertension   . Sinus arrest 08/26/2014    Past Surgical History:  Procedure Laterality Date  . ABDOMINAL HYSTERECTOMY      OB History   No obstetric history on file.      Home Medications    Prior to Admission medications   Medication Sig Start Date End Date Taking? Authorizing Provider  carvedilol (COREG) 12.5 MG tablet Take 1 tablet (12.5 mg total) by mouth 2 (two) times daily. Please make yearly appt with Dr. Caryl Comes for January before anymore refills. 1st attempt 05/13/19   Deboraha Sprang, MD  hydrALAZINE (APRESOLINE) 100 MG tablet Take 1 tablet (100 mg total) by mouth 2 (two) times daily. Please make yearly appt with Dr. Caryl Comes for January before anymore refills. 1st attempt 05/13/19   Deboraha Sprang, MD  hydrochlorothiazide (HYDRODIURIL) 25 MG tablet Take 1 tablet (25 mg total) by mouth daily. Please make yearly appt with Dr. Caryl Comes for January before anymore refills. 1st attempt 05/13/19   Deboraha Sprang, MD    Family History Family History  Problem Relation Age of Onset  . Stroke Mother   . Cancer - Prostate Father     Social  History Social History   Tobacco Use  . Smoking status: Never Smoker  . Smokeless tobacco: Never Used  Substance Use Topics  . Alcohol use: No  . Drug use: No     Allergies   Crab [shellfish allergy] and Pineapple   Review of Systems Review of Systems  Constitutional: Negative.   HENT: Negative.   Respiratory: Negative.   Cardiovascular: Negative.   Gastrointestinal: Negative.   Neurological: Negative.      Physical Exam Triage Vital Signs ED Triage Vitals  Enc Vitals Group     BP      Pulse      Resp      Temp      Temp src      SpO2      Weight      Height      Head Circumference      Peak Flow      Pain Score      Pain Loc      Pain Edu?      Excl. in [place]?    No data found.  Updated Vital Signs BP (!) 196/79   Pulse (!) 108   Temp 98.5 F (36.9 C)   Resp 20   SpO2 98%   Visual Acuity Right Eye Distance:   Left Eye Distance:   Bilateral Distance:  Right Eye Near:   Left Eye Near:    Bilateral Near:     Physical Exam Vitals and nursing note reviewed.  Constitutional:      General: She is not in acute distress.    Appearance: Normal appearance. She is normal weight. She is not ill-appearing or toxic-appearing.  HENT:     Head: Normocephalic.     Right Ear: Tympanic membrane, ear canal and external ear normal. There is no impacted cerumen.     Left Ear: Tympanic membrane, ear canal and external ear normal. There is no impacted cerumen.     Nose: Nose normal. No congestion.     Mouth/Throat:     Mouth: Mucous membranes are moist.     Pharynx: No oropharyngeal exudate or posterior oropharyngeal erythema.  Cardiovascular:     Rate and Rhythm: Normal rate and regular rhythm.     Pulses: Normal pulses.     Heart sounds: Normal heart sounds. No murmur.  Pulmonary:     Effort: Pulmonary effort is normal. No respiratory distress.     Breath sounds: No wheezing or rhonchi.  Chest:     Chest wall: No tenderness.  Abdominal:     General:  Abdomen is flat. Bowel sounds are normal. There is no distension.     Palpations: There is no mass.  Skin:    Capillary Refill: Capillary refill takes less than 2 seconds.  Neurological:     Mental Status: She is alert and oriented to person, place, and time.      UC Treatments / Results  Labs (all labs ordered are listed, but only abnormal results are displayed) Labs Reviewed  NOVEL CORONAVIRUS, NAA    EKG   Radiology No results found.  Procedures Procedures (including critical care time)  Medications Ordered in UC Medications - No data to display  Initial Impression / Assessment and Plan / UC Course  I have reviewed the triage vital signs and the nursing notes.  Pertinent labs & imaging results that were available during my care of the patient were reviewed by me and considered in my medical decision making (see chart for details).   Patient stable for discharge.  Benign physical exam.  Advised patient to quarantine until COVID-19 result become available.  Go to ED for worsening symptoms.  Patient verbalized an understanding of the plan of care.   Final Clinical Impressions(s) / UC Diagnoses   Final diagnoses:  Suspected COVID-19 virus infection     Discharge Instructions     COVID testing ordered.  It will take between 2-7 days for test results.  Someone will contact you regarding abnormal results.    In the meantime: You should remain isolated in your home for 10 days from symptom onset AND greater than 72 hours after symptoms resolution (absence of fever without the use of fever-reducing medication and improvement in respiratory symptoms), whichever is longer Get plenty of rest and push fluids Use medications daily for symptom relief Use OTC medications like ibuprofen or tylenol as needed fever or pain Call or go to the ED if you have any new or worsening symptoms such as fever, worsening cough, shortness of breath, chest tightness, chest pain, turning blue,  changes in mental status, etc...     ED Prescriptions    None     PDMP not reviewed this encounter.   Durward Parcel, FNP 06/21/19 1333

## 2019-06-22 LAB — NOVEL CORONAVIRUS, NAA: SARS-CoV-2, NAA: NOT DETECTED

## 2019-06-27 MED ORDER — CARVEDILOL 6.25 MG PO TABS: 12.5000 mg | ORAL_TABLET | Freq: Two times a day (BID) | ORAL | 1 refills | Status: DC

## 2019-06-27 NOTE — Telephone Encounter (Signed)
A  lets have her cut her carvedilol in half please  Thanks SK

## 2019-06-27 NOTE — Telephone Encounter (Signed)
Spoke with pt.  Advised of MD recommendation to decrease dose to 1/2 tab (6.25mg ) twice daily.  Updated Rx sent to pharmacy.

## 2019-06-28 DIAGNOSIS — Z789 Other specified health status: Secondary | ICD-10-CM | POA: Diagnosis not present

## 2019-06-28 DIAGNOSIS — Z299 Encounter for prophylactic measures, unspecified: Secondary | ICD-10-CM | POA: Diagnosis not present

## 2019-06-28 DIAGNOSIS — I10 Essential (primary) hypertension: Secondary | ICD-10-CM | POA: Diagnosis not present

## 2019-06-28 DIAGNOSIS — E7800 Pure hypercholesterolemia, unspecified: Secondary | ICD-10-CM | POA: Diagnosis not present

## 2019-06-30 ENCOUNTER — Ambulatory Visit: Payer: Medicare Other | Admitting: Internal Medicine

## 2019-07-11 DIAGNOSIS — I160 Hypertensive urgency: Secondary | ICD-10-CM | POA: Diagnosis not present

## 2019-07-11 DIAGNOSIS — I10 Essential (primary) hypertension: Secondary | ICD-10-CM | POA: Diagnosis not present

## 2019-07-11 DIAGNOSIS — Z79899 Other long term (current) drug therapy: Secondary | ICD-10-CM | POA: Diagnosis not present

## 2019-07-12 DIAGNOSIS — I10 Essential (primary) hypertension: Secondary | ICD-10-CM | POA: Diagnosis not present

## 2019-07-12 DIAGNOSIS — Z713 Dietary counseling and surveillance: Secondary | ICD-10-CM | POA: Diagnosis not present

## 2019-07-12 DIAGNOSIS — E7800 Pure hypercholesterolemia, unspecified: Secondary | ICD-10-CM | POA: Diagnosis not present

## 2019-07-12 DIAGNOSIS — Z299 Encounter for prophylactic measures, unspecified: Secondary | ICD-10-CM | POA: Diagnosis not present

## 2019-07-19 DIAGNOSIS — E7800 Pure hypercholesterolemia, unspecified: Secondary | ICD-10-CM | POA: Diagnosis not present

## 2019-07-19 DIAGNOSIS — I10 Essential (primary) hypertension: Secondary | ICD-10-CM | POA: Diagnosis not present

## 2019-08-12 DIAGNOSIS — Z79899 Other long term (current) drug therapy: Secondary | ICD-10-CM | POA: Diagnosis not present

## 2019-08-12 DIAGNOSIS — I10 Essential (primary) hypertension: Secondary | ICD-10-CM | POA: Diagnosis not present

## 2019-08-15 DIAGNOSIS — I10 Essential (primary) hypertension: Secondary | ICD-10-CM | POA: Diagnosis not present

## 2019-08-16 DIAGNOSIS — Z713 Dietary counseling and surveillance: Secondary | ICD-10-CM | POA: Diagnosis not present

## 2019-08-16 DIAGNOSIS — Z299 Encounter for prophylactic measures, unspecified: Secondary | ICD-10-CM | POA: Diagnosis not present

## 2019-08-16 DIAGNOSIS — I10 Essential (primary) hypertension: Secondary | ICD-10-CM | POA: Diagnosis not present

## 2019-08-18 DIAGNOSIS — E7800 Pure hypercholesterolemia, unspecified: Secondary | ICD-10-CM | POA: Diagnosis not present

## 2019-08-18 DIAGNOSIS — I10 Essential (primary) hypertension: Secondary | ICD-10-CM | POA: Diagnosis not present

## 2019-08-30 DIAGNOSIS — E7800 Pure hypercholesterolemia, unspecified: Secondary | ICD-10-CM | POA: Diagnosis not present

## 2019-08-30 DIAGNOSIS — I10 Essential (primary) hypertension: Secondary | ICD-10-CM | POA: Diagnosis not present

## 2019-09-20 DIAGNOSIS — I10 Essential (primary) hypertension: Secondary | ICD-10-CM | POA: Diagnosis not present

## 2019-10-06 DIAGNOSIS — E7800 Pure hypercholesterolemia, unspecified: Secondary | ICD-10-CM | POA: Diagnosis not present

## 2019-10-06 DIAGNOSIS — I10 Essential (primary) hypertension: Secondary | ICD-10-CM | POA: Diagnosis not present

## 2019-10-21 DIAGNOSIS — I10 Essential (primary) hypertension: Secondary | ICD-10-CM | POA: Diagnosis not present

## 2019-11-20 DIAGNOSIS — E7800 Pure hypercholesterolemia, unspecified: Secondary | ICD-10-CM | POA: Diagnosis not present

## 2019-11-20 DIAGNOSIS — I10 Essential (primary) hypertension: Secondary | ICD-10-CM | POA: Diagnosis not present

## 2019-11-23 DIAGNOSIS — Z299 Encounter for prophylactic measures, unspecified: Secondary | ICD-10-CM | POA: Diagnosis not present

## 2019-11-23 DIAGNOSIS — I10 Essential (primary) hypertension: Secondary | ICD-10-CM | POA: Diagnosis not present

## 2019-11-23 DIAGNOSIS — Z0000 Encounter for general adult medical examination without abnormal findings: Secondary | ICD-10-CM | POA: Diagnosis not present

## 2019-11-23 DIAGNOSIS — R5383 Other fatigue: Secondary | ICD-10-CM | POA: Diagnosis not present

## 2019-11-23 DIAGNOSIS — E7800 Pure hypercholesterolemia, unspecified: Secondary | ICD-10-CM | POA: Diagnosis not present

## 2019-11-23 DIAGNOSIS — Z79899 Other long term (current) drug therapy: Secondary | ICD-10-CM | POA: Diagnosis not present

## 2019-11-23 DIAGNOSIS — Z1211 Encounter for screening for malignant neoplasm of colon: Secondary | ICD-10-CM | POA: Diagnosis not present

## 2019-11-23 DIAGNOSIS — Z7189 Other specified counseling: Secondary | ICD-10-CM | POA: Diagnosis not present

## 2019-12-21 DIAGNOSIS — I10 Essential (primary) hypertension: Secondary | ICD-10-CM | POA: Diagnosis not present

## 2019-12-21 DIAGNOSIS — E7800 Pure hypercholesterolemia, unspecified: Secondary | ICD-10-CM | POA: Diagnosis not present

## 2020-01-20 DIAGNOSIS — E7800 Pure hypercholesterolemia, unspecified: Secondary | ICD-10-CM | POA: Diagnosis not present

## 2020-01-20 DIAGNOSIS — I10 Essential (primary) hypertension: Secondary | ICD-10-CM | POA: Diagnosis not present

## 2020-02-08 DIAGNOSIS — E7800 Pure hypercholesterolemia, unspecified: Secondary | ICD-10-CM | POA: Diagnosis not present

## 2020-02-08 DIAGNOSIS — I10 Essential (primary) hypertension: Secondary | ICD-10-CM | POA: Diagnosis not present

## 2020-02-21 DIAGNOSIS — I10 Essential (primary) hypertension: Secondary | ICD-10-CM | POA: Diagnosis not present

## 2020-03-06 DIAGNOSIS — Z713 Dietary counseling and surveillance: Secondary | ICD-10-CM | POA: Diagnosis not present

## 2020-03-06 DIAGNOSIS — I10 Essential (primary) hypertension: Secondary | ICD-10-CM | POA: Diagnosis not present

## 2020-03-06 DIAGNOSIS — Z299 Encounter for prophylactic measures, unspecified: Secondary | ICD-10-CM | POA: Diagnosis not present

## 2020-03-22 DIAGNOSIS — E7800 Pure hypercholesterolemia, unspecified: Secondary | ICD-10-CM | POA: Diagnosis not present

## 2020-03-22 DIAGNOSIS — I10 Essential (primary) hypertension: Secondary | ICD-10-CM | POA: Diagnosis not present

## 2020-04-20 DIAGNOSIS — I10 Essential (primary) hypertension: Secondary | ICD-10-CM | POA: Diagnosis not present

## 2020-04-20 DIAGNOSIS — E7800 Pure hypercholesterolemia, unspecified: Secondary | ICD-10-CM | POA: Diagnosis not present

## 2020-04-21 DIAGNOSIS — I10 Essential (primary) hypertension: Secondary | ICD-10-CM | POA: Diagnosis not present

## 2020-05-22 DIAGNOSIS — I10 Essential (primary) hypertension: Secondary | ICD-10-CM | POA: Diagnosis not present

## 2020-05-22 DIAGNOSIS — E7800 Pure hypercholesterolemia, unspecified: Secondary | ICD-10-CM | POA: Diagnosis not present

## 2020-05-29 DIAGNOSIS — Z299 Encounter for prophylactic measures, unspecified: Secondary | ICD-10-CM | POA: Diagnosis not present

## 2020-05-29 DIAGNOSIS — I10 Essential (primary) hypertension: Secondary | ICD-10-CM | POA: Diagnosis not present

## 2020-05-29 DIAGNOSIS — Z713 Dietary counseling and surveillance: Secondary | ICD-10-CM | POA: Diagnosis not present

## 2020-06-21 DIAGNOSIS — I10 Essential (primary) hypertension: Secondary | ICD-10-CM | POA: Diagnosis not present

## 2020-06-21 DIAGNOSIS — E7800 Pure hypercholesterolemia, unspecified: Secondary | ICD-10-CM | POA: Diagnosis not present

## 2020-07-23 DIAGNOSIS — I10 Essential (primary) hypertension: Secondary | ICD-10-CM | POA: Diagnosis not present

## 2020-08-08 DIAGNOSIS — I10 Essential (primary) hypertension: Secondary | ICD-10-CM | POA: Diagnosis not present

## 2020-08-28 DIAGNOSIS — Z789 Other specified health status: Secondary | ICD-10-CM | POA: Diagnosis not present

## 2020-08-28 DIAGNOSIS — E7800 Pure hypercholesterolemia, unspecified: Secondary | ICD-10-CM | POA: Diagnosis not present

## 2020-08-28 DIAGNOSIS — I10 Essential (primary) hypertension: Secondary | ICD-10-CM | POA: Diagnosis not present

## 2020-08-28 DIAGNOSIS — L723 Sebaceous cyst: Secondary | ICD-10-CM | POA: Diagnosis not present

## 2020-08-28 DIAGNOSIS — Z299 Encounter for prophylactic measures, unspecified: Secondary | ICD-10-CM | POA: Diagnosis not present

## 2020-09-17 DIAGNOSIS — L720 Epidermal cyst: Secondary | ICD-10-CM | POA: Diagnosis not present

## 2020-09-19 DIAGNOSIS — L720 Epidermal cyst: Secondary | ICD-10-CM | POA: Diagnosis not present

## 2020-09-19 DIAGNOSIS — L723 Sebaceous cyst: Secondary | ICD-10-CM | POA: Diagnosis not present

## 2020-09-20 DIAGNOSIS — I10 Essential (primary) hypertension: Secondary | ICD-10-CM | POA: Diagnosis not present

## 2020-10-03 DIAGNOSIS — L720 Epidermal cyst: Secondary | ICD-10-CM | POA: Diagnosis not present

## 2020-10-19 DIAGNOSIS — I10 Essential (primary) hypertension: Secondary | ICD-10-CM | POA: Diagnosis not present

## 2020-11-20 DIAGNOSIS — I10 Essential (primary) hypertension: Secondary | ICD-10-CM | POA: Diagnosis not present

## 2020-12-05 DIAGNOSIS — Z0000 Encounter for general adult medical examination without abnormal findings: Secondary | ICD-10-CM | POA: Diagnosis not present

## 2020-12-05 DIAGNOSIS — Z79899 Other long term (current) drug therapy: Secondary | ICD-10-CM | POA: Diagnosis not present

## 2020-12-05 DIAGNOSIS — Z789 Other specified health status: Secondary | ICD-10-CM | POA: Diagnosis not present

## 2020-12-05 DIAGNOSIS — Z299 Encounter for prophylactic measures, unspecified: Secondary | ICD-10-CM | POA: Diagnosis not present

## 2020-12-05 DIAGNOSIS — R5383 Other fatigue: Secondary | ICD-10-CM | POA: Diagnosis not present

## 2020-12-05 DIAGNOSIS — Z7189 Other specified counseling: Secondary | ICD-10-CM | POA: Diagnosis not present

## 2020-12-05 DIAGNOSIS — I10 Essential (primary) hypertension: Secondary | ICD-10-CM | POA: Diagnosis not present

## 2020-12-05 DIAGNOSIS — E7800 Pure hypercholesterolemia, unspecified: Secondary | ICD-10-CM | POA: Diagnosis not present

## 2020-12-11 DIAGNOSIS — H43812 Vitreous degeneration, left eye: Secondary | ICD-10-CM | POA: Diagnosis not present

## 2020-12-11 DIAGNOSIS — H25813 Combined forms of age-related cataract, bilateral: Secondary | ICD-10-CM | POA: Diagnosis not present

## 2020-12-11 DIAGNOSIS — H04123 Dry eye syndrome of bilateral lacrimal glands: Secondary | ICD-10-CM | POA: Diagnosis not present

## 2020-12-11 DIAGNOSIS — H10413 Chronic giant papillary conjunctivitis, bilateral: Secondary | ICD-10-CM | POA: Diagnosis not present

## 2020-12-11 DIAGNOSIS — H40013 Open angle with borderline findings, low risk, bilateral: Secondary | ICD-10-CM | POA: Diagnosis not present

## 2020-12-14 ENCOUNTER — Other Ambulatory Visit: Payer: Self-pay

## 2020-12-14 ENCOUNTER — Encounter: Payer: Self-pay | Admitting: Student

## 2020-12-14 ENCOUNTER — Ambulatory Visit: Payer: Medicare Other | Admitting: Student

## 2020-12-14 VITALS — BP 140/72 | HR 71 | Ht 60.0 in | Wt 184.0 lb

## 2020-12-14 DIAGNOSIS — E669 Obesity, unspecified: Secondary | ICD-10-CM

## 2020-12-14 DIAGNOSIS — R001 Bradycardia, unspecified: Secondary | ICD-10-CM | POA: Diagnosis not present

## 2020-12-14 DIAGNOSIS — I10 Essential (primary) hypertension: Secondary | ICD-10-CM | POA: Diagnosis not present

## 2020-12-14 NOTE — Progress Notes (Signed)
PCP:  Ignatius Specking, MD Primary Cardiologist: None Electrophysiologist: Sherryl Manges, MD   Kristi Ward is a 80 y.o. female seen today for Sherryl Manges, MD for routine electrophysiology followup.  Since last being seen in our clinic the patient reports doing very well.  BP at home runs 120-140s. HRs run in 60-70s at rest. She missed regular follow up in the setting of COVID but has not had any issues. She had her Annual physical earlier this month with a good report.  She denies chest pain, palpitations, dyspnea, PND, orthopnea, nausea, vomiting, dizziness, syncope, edema, weight gain, or early satiety.  Past Medical History:  Diagnosis Date   Bradycardia    a. admx 3/16 with syncope and HR 24 >> HR recovered with DC of beta blocker, CCB   Hypertension    Past Surgical History:  Procedure Laterality Date   ABDOMINAL HYSTERECTOMY      Current Outpatient Medications  Medication Sig Dispense Refill   acetaminophen (TYLENOL) 325 MG tablet Take 650 mg by mouth as needed.     candesartan (ATACAND) 32 MG tablet Take 32 mg by mouth daily.     carvedilol (COREG) 25 MG tablet Take 25 mg by mouth 2 (two) times daily with a meal.     cloNIDine (CATAPRES) 0.1 MG tablet Take 0.1 mg by mouth 2 (two) times daily.     hydrALAZINE (APRESOLINE) 100 MG tablet Take 1 tablet (100 mg total) by mouth 2 (two) times daily. Please make yearly appt with Dr. Graciela Husbands for January before anymore refills. 1st attempt 180 tablet 0   hydrochlorothiazide (HYDRODIURIL) 25 MG tablet Take 1 tablet (25 mg total) by mouth daily. Please make yearly appt with Dr. Graciela Husbands for January before anymore refills. 1st attempt 90 tablet 0   No current facility-administered medications for this visit.    Allergies  Allergen Reactions   Crab [Shellfish Allergy] Hives   Pineapple Other (See Comments)    Per Husband, upset stomach    Social History   Socioeconomic History   Marital status: Married    Spouse name: Not on file    Number of children: Not on file   Years of education: Not on file   Highest education level: Not on file  Occupational History   Occupation: Retired  Tobacco Use   Smoking status: Never   Smokeless tobacco: Never  Vaping Use   Vaping Use: Never used  Substance and Sexual Activity   Alcohol use: No   Drug use: No   Sexual activity: Not on file  Other Topics Concern   Not on file  Social History Narrative   Lives in [place], [place] with her husband.   Social Determinants of Health   Financial Resource Strain: Not on file  Food Insecurity: Not on file  Transportation Needs: Not on file  Physical Activity: Not on file  Stress: Not on file  Social Connections: Not on file  Intimate Partner Violence: Not on file     Review of Systems: General: No chills, fever, night sweats or weight changes  Cardiovascular:  No chest pain, dyspnea on exertion, edema, orthopnea, palpitations, paroxysmal nocturnal dyspnea Dermatological: No rash, lesions or masses Respiratory: No cough, dyspnea Urologic: No hematuria, dysuria Abdominal: No nausea, vomiting, diarrhea, bright red blood per rectum, melena, or hematemesis Neurologic: No visual changes, weakness, changes in mental status All other systems reviewed and are otherwise negative except as noted above.  Physical Exam: Vitals:   12/14/20 0905  BP: 140/72  Pulse: 71  SpO2: 97%  Weight: 184 lb (83.5 kg)  Height: 5' (1.524 m)    GEN- The patient is well appearing, alert and oriented x 3 today.   HEENT: normocephalic, atraumatic; sclera clear, conjunctiva pink; hearing intact; oropharynx clear; neck supple, no JVP Lymph- no cervical lymphadenopathy Lungs- Clear to ausculation bilaterally, normal work of breathing.  No wheezes, rales, rhonchi Heart- Regular rate and rhythm, no murmurs, rubs or gallops, PMI not laterally displaced GI- soft, non-tender, non-distended, bowel sounds present, no hepatosplenomegaly Extremities- no clubbing,  cyanosis, or edema; DP/PT/radial pulses 2+ bilaterally MS- no significant deformity or atrophy Skin- warm and dry, no rash or lesion Psych- euthymic mood, full affect Neuro- strength and sensation are intact  EKG is ordered. Personal review of EKG from today shows NSR at 71 bpm, PR interval 190 ms, QRS 80 ms  Additional studies reviewed include: Previous EP office notes  Assessment and Plan:  Sinus bradycardia   Obesity - Body mass index is 35.94 kg/m.    Hypertension poorly controlled  HRs are stable on beta blockers.   Encouraged exercise and continued PCP follow up.   BP more stable at home. Continue to monitor on current regimen.   Request labs from PCP.   RTC 12 months. Sooner with symptoms.   Graciella Freer, PA-C  12/14/20 9:25 AM

## 2020-12-20 ENCOUNTER — Other Ambulatory Visit: Payer: Self-pay | Admitting: Internal Medicine

## 2020-12-20 DIAGNOSIS — I10 Essential (primary) hypertension: Secondary | ICD-10-CM | POA: Diagnosis not present

## 2020-12-20 DIAGNOSIS — Z139 Encounter for screening, unspecified: Secondary | ICD-10-CM

## 2020-12-21 ENCOUNTER — Other Ambulatory Visit: Payer: Self-pay

## 2020-12-21 ENCOUNTER — Ambulatory Visit
Admission: RE | Admit: 2020-12-21 | Discharge: 2020-12-21 | Disposition: A | Discharge status: Home / Self Care | Payer: Medicare Other | Source: Ambulatory Visit | Attending: Internal Medicine | Admitting: Internal Medicine

## 2020-12-21 DIAGNOSIS — Z139 Encounter for screening, unspecified: Secondary | ICD-10-CM

## 2020-12-21 DIAGNOSIS — Z1231 Encounter for screening mammogram for malignant neoplasm of breast: Secondary | ICD-10-CM | POA: Diagnosis not present

## 2021-01-18 DIAGNOSIS — I10 Essential (primary) hypertension: Secondary | ICD-10-CM | POA: Diagnosis not present

## 2021-01-20 DIAGNOSIS — I10 Essential (primary) hypertension: Secondary | ICD-10-CM | POA: Diagnosis not present

## 2021-02-20 DIAGNOSIS — I10 Essential (primary) hypertension: Secondary | ICD-10-CM | POA: Diagnosis not present

## 2021-03-19 DIAGNOSIS — Z23 Encounter for immunization: Secondary | ICD-10-CM | POA: Diagnosis not present

## 2021-03-19 DIAGNOSIS — I10 Essential (primary) hypertension: Secondary | ICD-10-CM | POA: Diagnosis not present

## 2021-03-19 DIAGNOSIS — Z299 Encounter for prophylactic measures, unspecified: Secondary | ICD-10-CM | POA: Diagnosis not present

## 2021-03-19 DIAGNOSIS — R5383 Other fatigue: Secondary | ICD-10-CM | POA: Diagnosis not present

## 2021-03-19 DIAGNOSIS — H109 Unspecified conjunctivitis: Secondary | ICD-10-CM | POA: Diagnosis not present

## 2021-03-19 DIAGNOSIS — D649 Anemia, unspecified: Secondary | ICD-10-CM | POA: Diagnosis not present

## 2021-03-22 DIAGNOSIS — I10 Essential (primary) hypertension: Secondary | ICD-10-CM | POA: Diagnosis not present

## 2021-04-07 ENCOUNTER — Other Ambulatory Visit: Payer: Self-pay | Admitting: Internal Medicine

## 2021-04-22 DIAGNOSIS — I10 Essential (primary) hypertension: Secondary | ICD-10-CM | POA: Diagnosis not present

## 2021-05-22 DIAGNOSIS — I10 Essential (primary) hypertension: Secondary | ICD-10-CM | POA: Diagnosis not present

## 2021-06-21 DIAGNOSIS — I10 Essential (primary) hypertension: Secondary | ICD-10-CM | POA: Diagnosis not present

## 2021-07-09 DIAGNOSIS — Z299 Encounter for prophylactic measures, unspecified: Secondary | ICD-10-CM | POA: Diagnosis not present

## 2021-07-09 DIAGNOSIS — J069 Acute upper respiratory infection, unspecified: Secondary | ICD-10-CM | POA: Diagnosis not present

## 2021-07-09 DIAGNOSIS — I10 Essential (primary) hypertension: Secondary | ICD-10-CM | POA: Diagnosis not present

## 2021-07-09 DIAGNOSIS — R5383 Other fatigue: Secondary | ICD-10-CM | POA: Diagnosis not present

## 2021-07-21 DIAGNOSIS — I10 Essential (primary) hypertension: Secondary | ICD-10-CM | POA: Diagnosis not present

## 2021-08-20 DIAGNOSIS — I10 Essential (primary) hypertension: Secondary | ICD-10-CM | POA: Diagnosis not present

## 2021-09-19 DIAGNOSIS — I10 Essential (primary) hypertension: Secondary | ICD-10-CM | POA: Diagnosis not present

## 2021-10-09 DIAGNOSIS — Z713 Dietary counseling and surveillance: Secondary | ICD-10-CM | POA: Diagnosis not present

## 2021-10-09 DIAGNOSIS — Z299 Encounter for prophylactic measures, unspecified: Secondary | ICD-10-CM | POA: Diagnosis not present

## 2021-10-09 DIAGNOSIS — I10 Essential (primary) hypertension: Secondary | ICD-10-CM | POA: Diagnosis not present

## 2021-10-09 DIAGNOSIS — M1990 Unspecified osteoarthritis, unspecified site: Secondary | ICD-10-CM | POA: Diagnosis not present

## 2021-10-09 DIAGNOSIS — M25561 Pain in right knee: Secondary | ICD-10-CM | POA: Diagnosis not present

## 2021-10-20 DIAGNOSIS — I10 Essential (primary) hypertension: Secondary | ICD-10-CM | POA: Diagnosis not present

## 2021-11-19 DIAGNOSIS — I10 Essential (primary) hypertension: Secondary | ICD-10-CM | POA: Diagnosis not present

## 2021-12-09 ENCOUNTER — Other Ambulatory Visit: Payer: Medicare Other

## 2021-12-09 ENCOUNTER — Other Ambulatory Visit (HOSPITAL_COMMUNITY): Payer: Medicare Other

## 2021-12-11 ENCOUNTER — Other Ambulatory Visit: Payer: Self-pay | Admitting: Internal Medicine

## 2021-12-11 DIAGNOSIS — Z79899 Other long term (current) drug therapy: Secondary | ICD-10-CM | POA: Diagnosis not present

## 2021-12-11 DIAGNOSIS — E7800 Pure hypercholesterolemia, unspecified: Secondary | ICD-10-CM | POA: Diagnosis not present

## 2021-12-11 DIAGNOSIS — Z7189 Other specified counseling: Secondary | ICD-10-CM | POA: Diagnosis not present

## 2021-12-11 DIAGNOSIS — Z0000 Encounter for general adult medical examination without abnormal findings: Secondary | ICD-10-CM | POA: Diagnosis not present

## 2021-12-11 DIAGNOSIS — Z1231 Encounter for screening mammogram for malignant neoplasm of breast: Secondary | ICD-10-CM

## 2021-12-11 DIAGNOSIS — I10 Essential (primary) hypertension: Secondary | ICD-10-CM | POA: Diagnosis not present

## 2021-12-11 DIAGNOSIS — R5383 Other fatigue: Secondary | ICD-10-CM | POA: Diagnosis not present

## 2021-12-11 DIAGNOSIS — Z789 Other specified health status: Secondary | ICD-10-CM | POA: Diagnosis not present

## 2021-12-11 DIAGNOSIS — Z299 Encounter for prophylactic measures, unspecified: Secondary | ICD-10-CM | POA: Diagnosis not present

## 2021-12-13 IMAGING — MG MM DIGITAL SCREENING BILAT W/ TOMO AND CAD
8 series · 8 of 24 positions shown · non-contrast
Comparison: Previous exam(s).

ACR Breast Density Category a: The breast tissue is almost entirely
fatty.

CLINICAL DATA: Screening.

EXAM:
DIGITAL SCREENING BILATERAL MAMMOGRAM WITH TOMOSYNTHESIS AND CAD
TECHNIQUE: Bilateral screening digital craniocaudal and mediolateral oblique
mammograms were obtained. Bilateral screening digital breast
tomosynthesis was performed. The images were evaluated with
computer-aided detection.

[L CC synth-2D]
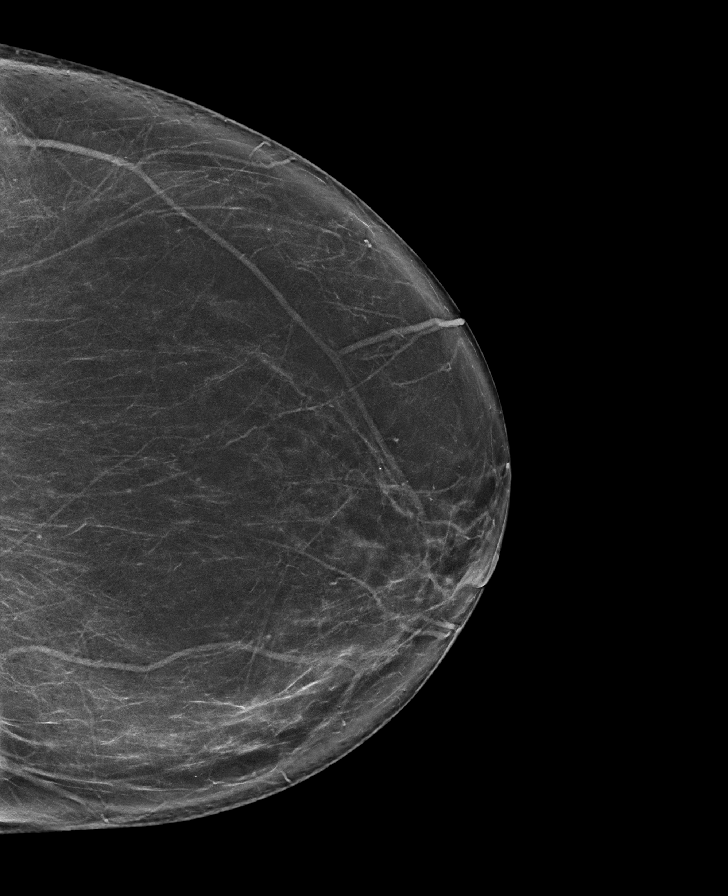

[R MLO synth-2D]
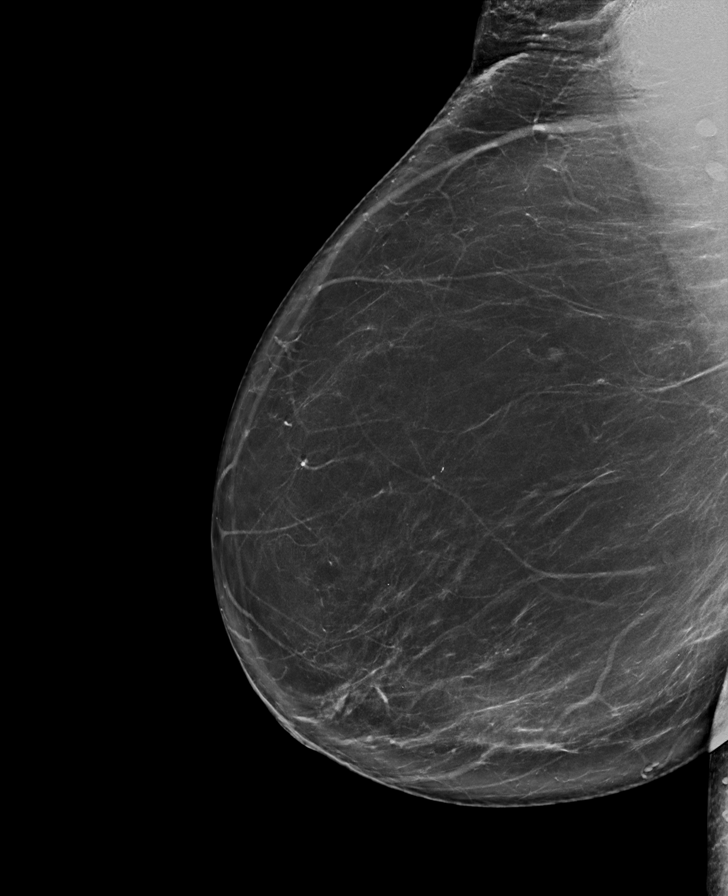

[R CC synth-2D]
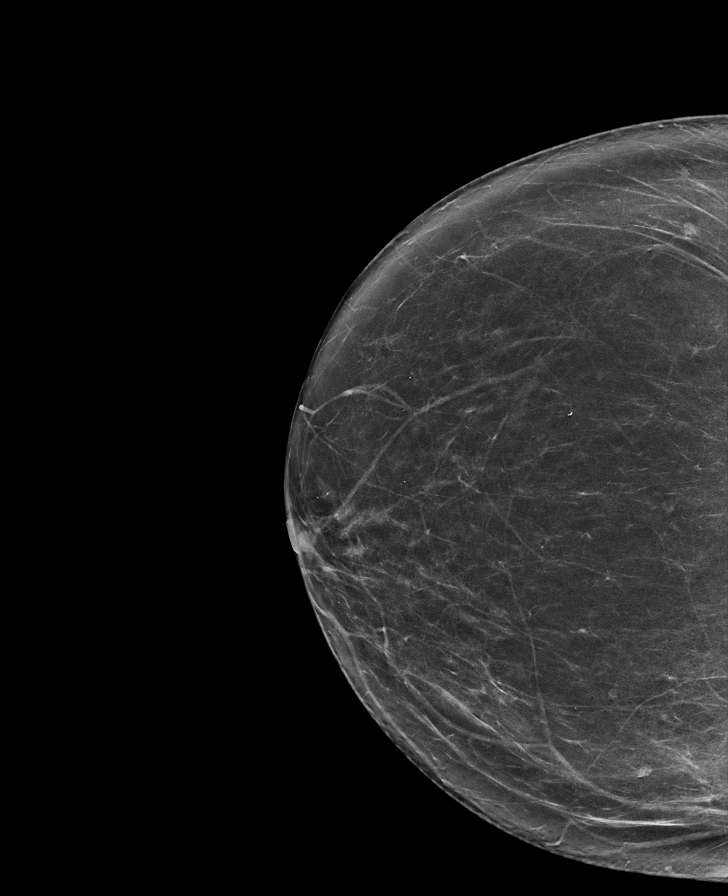

[L MLO synth-2D]
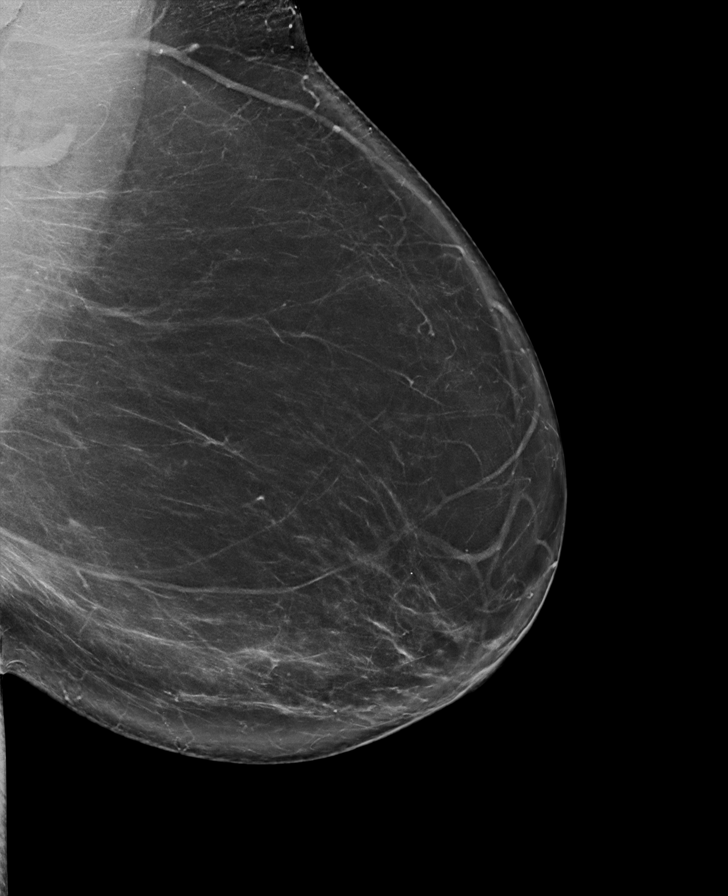

[L CC tomo · tomo slice 43/85.0]
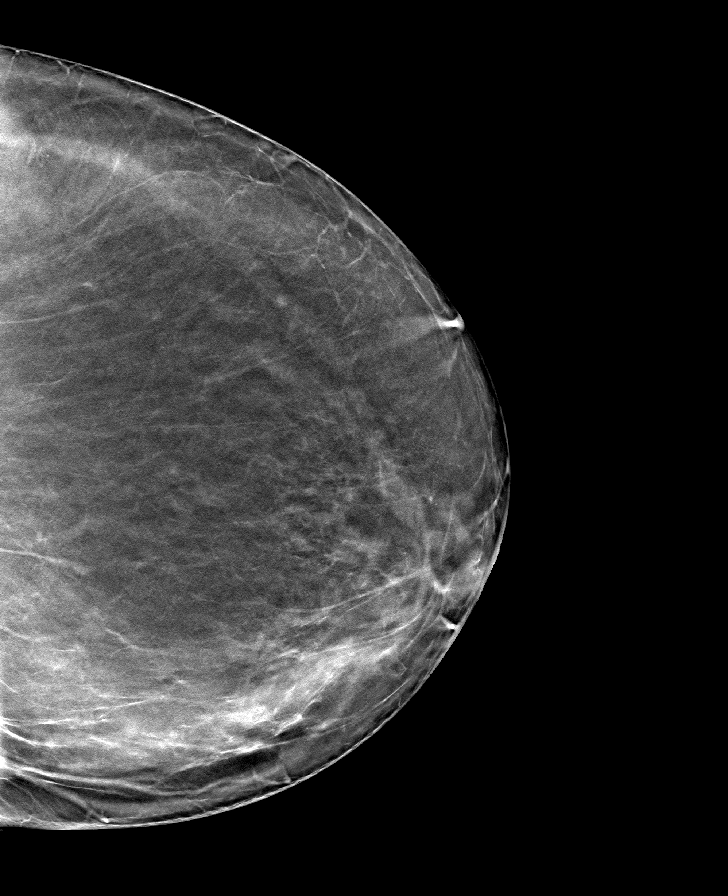

[R CC tomo · tomo slice 39/77.0]
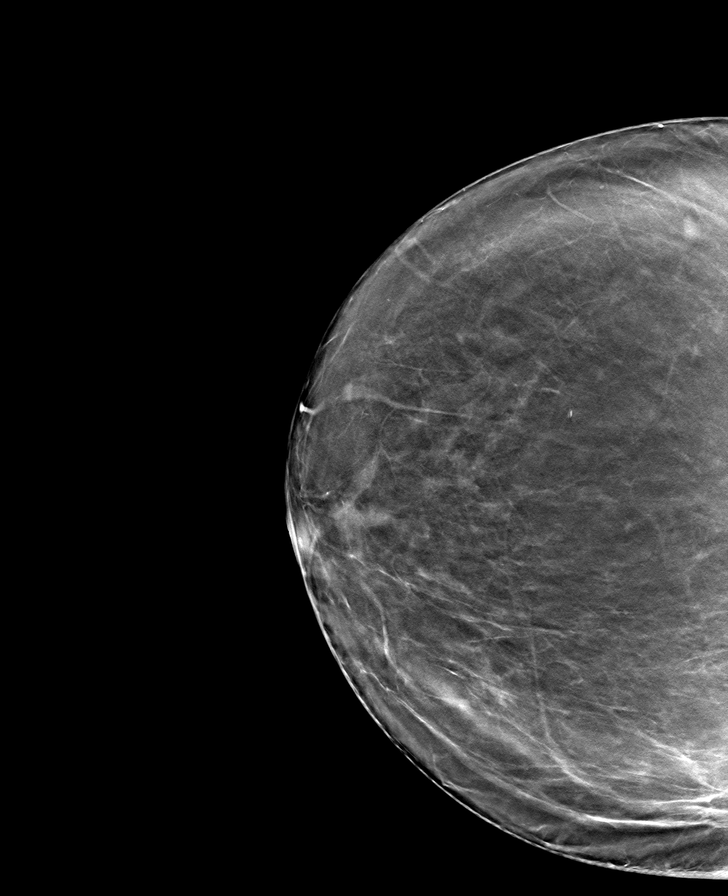

[R MLO tomo · tomo slice 45/90.0]
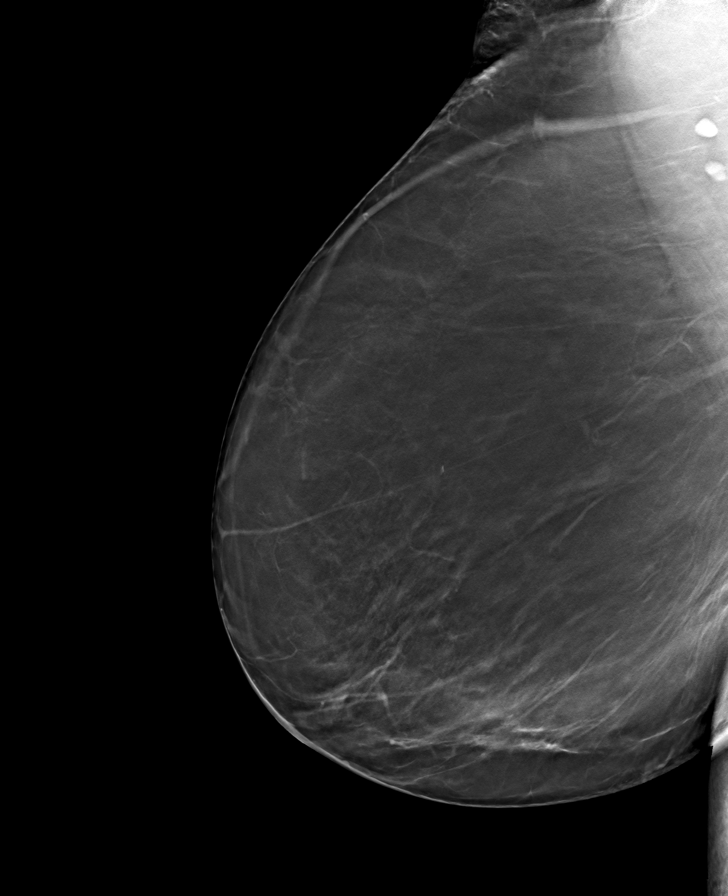

[L MLO tomo · tomo slice 47/92.0]
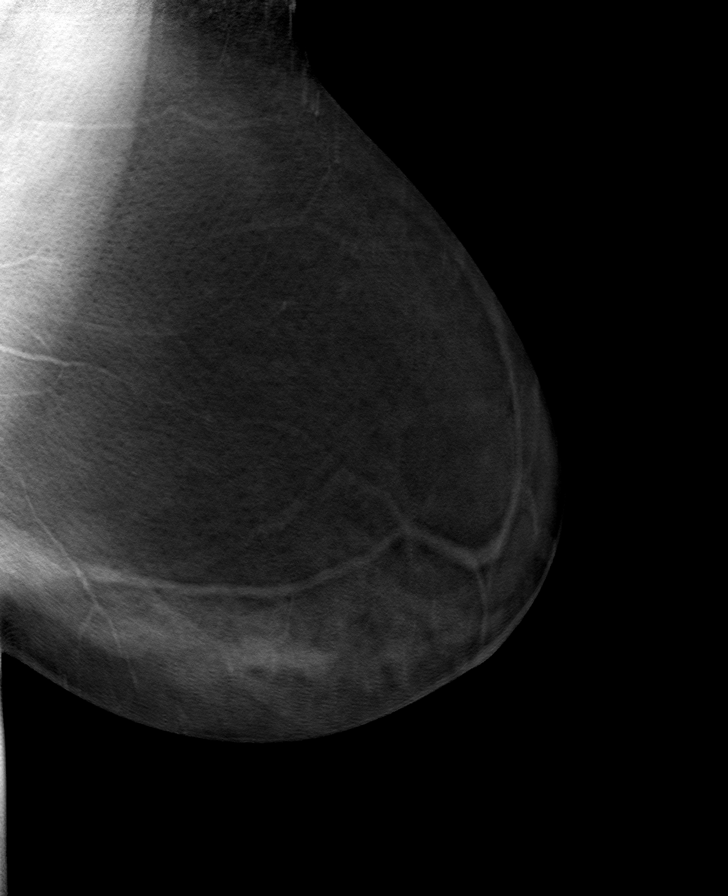

[8 of 24 positions shown; findings below may reference images not displayed]

FINDINGS: There are no findings suspicious for malignancy.
IMPRESSION: No mammographic evidence of malignancy. A result letter of this
screening mammogram will be mailed directly to the patient.

RECOMMENDATION:
Screening mammogram in one year. (Code:0E-3-N98)

BI-RADS CATEGORY  1: Negative.

## 2021-12-17 ENCOUNTER — Encounter: Payer: Self-pay | Admitting: Internal Medicine

## 2021-12-17 ENCOUNTER — Ambulatory Visit: Payer: Medicare Other | Admitting: Internal Medicine

## 2021-12-17 VITALS — BP 168/80 | HR 66 | Ht 60.0 in | Wt 189.0 lb

## 2021-12-17 DIAGNOSIS — R0600 Dyspnea, unspecified: Secondary | ICD-10-CM | POA: Diagnosis not present

## 2021-12-17 DIAGNOSIS — R001 Bradycardia, unspecified: Secondary | ICD-10-CM | POA: Diagnosis not present

## 2021-12-17 DIAGNOSIS — Z79899 Other long term (current) drug therapy: Secondary | ICD-10-CM

## 2021-12-17 DIAGNOSIS — Z23 Encounter for immunization: Secondary | ICD-10-CM | POA: Diagnosis not present

## 2021-12-17 MED ORDER — FUROSEMIDE 20 MG PO TABS: 20.0000 mg | ORAL_TABLET | ORAL | 3 refills | Status: DC

## 2021-12-18 DIAGNOSIS — H04123 Dry eye syndrome of bilateral lacrimal glands: Secondary | ICD-10-CM | POA: Diagnosis not present

## 2021-12-18 DIAGNOSIS — H40013 Open angle with borderline findings, low risk, bilateral: Secondary | ICD-10-CM | POA: Diagnosis not present

## 2021-12-18 DIAGNOSIS — H25813 Combined forms of age-related cataract, bilateral: Secondary | ICD-10-CM | POA: Diagnosis not present

## 2021-12-18 DIAGNOSIS — H43812 Vitreous degeneration, left eye: Secondary | ICD-10-CM | POA: Diagnosis not present

## 2021-12-18 DIAGNOSIS — H10413 Chronic giant papillary conjunctivitis, bilateral: Secondary | ICD-10-CM | POA: Diagnosis not present

## 2021-12-19 DIAGNOSIS — I10 Essential (primary) hypertension: Secondary | ICD-10-CM | POA: Diagnosis not present

## 2021-12-20 DIAGNOSIS — M859 Disorder of bone density and structure, unspecified: Secondary | ICD-10-CM | POA: Diagnosis not present

## 2021-12-20 DIAGNOSIS — Z79899 Other long term (current) drug therapy: Secondary | ICD-10-CM | POA: Diagnosis not present

## 2022-01-01 ENCOUNTER — Ambulatory Visit
Admission: RE | Admit: 2022-01-01 | Discharge: 2022-01-01 | Disposition: A | Discharge status: Home / Self Care | Payer: Medicare Other | Source: Ambulatory Visit | Attending: Internal Medicine | Admitting: Internal Medicine

## 2022-01-01 ENCOUNTER — Inpatient Hospital Stay: Admission: RE | Admit: 2022-01-01 | Payer: Medicare Other | Source: Ambulatory Visit

## 2022-01-01 DIAGNOSIS — Z1231 Encounter for screening mammogram for malignant neoplasm of breast: Secondary | ICD-10-CM | POA: Diagnosis not present

## 2022-01-02 DIAGNOSIS — Z1211 Encounter for screening for malignant neoplasm of colon: Secondary | ICD-10-CM | POA: Diagnosis not present

## 2022-01-02 DIAGNOSIS — Z1212 Encounter for screening for malignant neoplasm of rectum: Secondary | ICD-10-CM | POA: Diagnosis not present

## 2022-01-09 ENCOUNTER — Ambulatory Visit (HOSPITAL_COMMUNITY): Payer: Medicare Other | Attending: Internal Medicine

## 2022-01-09 ENCOUNTER — Other Ambulatory Visit: Payer: Medicare Other

## 2022-01-09 DIAGNOSIS — Z79899 Other long term (current) drug therapy: Secondary | ICD-10-CM

## 2022-01-09 DIAGNOSIS — R0600 Dyspnea, unspecified: Secondary | ICD-10-CM | POA: Diagnosis not present

## 2022-01-09 DIAGNOSIS — R001 Bradycardia, unspecified: Secondary | ICD-10-CM

## 2022-01-09 LAB — BASIC METABOLIC PANEL
BUN/Creatinine Ratio: 18 (ref 12–28)
BUN: 17 mg/dL (ref 8–27)
CO2: 27 mmol/L (ref 20–29)
Calcium: 9.9 mg/dL (ref 8.7–10.3)
Chloride: 101 mmol/L (ref 96–106)
Creatinine, Ser: 0.92 mg/dL (ref 0.57–1.00)
Glucose: 119 mg/dL — ABNORMAL HIGH (ref 70–99)
Potassium: 4.4 mmol/L (ref 3.5–5.2)
Sodium: 140 mmol/L (ref 134–144)
eGFR: 64 mL/min/{1.73_m2} (ref >59)

## 2022-01-09 LAB — ECHOCARDIOGRAM COMPLETE
Area-P 1/2: 3.15 cm2
S' Lateral: 2.5 cm

## 2022-01-20 DIAGNOSIS — I10 Essential (primary) hypertension: Secondary | ICD-10-CM | POA: Diagnosis not present

## 2022-02-19 DIAGNOSIS — I10 Essential (primary) hypertension: Secondary | ICD-10-CM | POA: Diagnosis not present

## 2022-03-20 DIAGNOSIS — I10 Essential (primary) hypertension: Secondary | ICD-10-CM | POA: Diagnosis not present

## 2022-03-20 DIAGNOSIS — Z299 Encounter for prophylactic measures, unspecified: Secondary | ICD-10-CM | POA: Diagnosis not present

## 2022-03-20 DIAGNOSIS — Z0000 Encounter for general adult medical examination without abnormal findings: Secondary | ICD-10-CM | POA: Diagnosis not present

## 2022-03-20 DIAGNOSIS — Z789 Other specified health status: Secondary | ICD-10-CM | POA: Diagnosis not present

## 2022-03-20 DIAGNOSIS — Z23 Encounter for immunization: Secondary | ICD-10-CM | POA: Diagnosis not present

## 2022-03-21 DIAGNOSIS — I10 Essential (primary) hypertension: Secondary | ICD-10-CM | POA: Diagnosis not present

## 2022-04-21 DIAGNOSIS — I10 Essential (primary) hypertension: Secondary | ICD-10-CM | POA: Diagnosis not present

## 2022-05-21 DIAGNOSIS — I10 Essential (primary) hypertension: Secondary | ICD-10-CM | POA: Diagnosis not present

## 2022-06-20 DIAGNOSIS — I10 Essential (primary) hypertension: Secondary | ICD-10-CM | POA: Diagnosis not present

## 2022-08-20 DIAGNOSIS — I10 Essential (primary) hypertension: Secondary | ICD-10-CM | POA: Diagnosis not present

## 2022-09-20 DIAGNOSIS — I10 Essential (primary) hypertension: Secondary | ICD-10-CM | POA: Diagnosis not present

## 2022-10-21 DIAGNOSIS — I10 Essential (primary) hypertension: Secondary | ICD-10-CM | POA: Diagnosis not present

## 2022-11-21 DIAGNOSIS — I10 Essential (primary) hypertension: Secondary | ICD-10-CM | POA: Diagnosis not present

## 2022-12-21 DIAGNOSIS — I10 Essential (primary) hypertension: Secondary | ICD-10-CM | POA: Diagnosis not present

## 2022-12-24 DIAGNOSIS — H43812 Vitreous degeneration, left eye: Secondary | ICD-10-CM | POA: Diagnosis not present

## 2022-12-24 DIAGNOSIS — H10413 Chronic giant papillary conjunctivitis, bilateral: Secondary | ICD-10-CM | POA: Diagnosis not present

## 2022-12-24 DIAGNOSIS — H04123 Dry eye syndrome of bilateral lacrimal glands: Secondary | ICD-10-CM | POA: Diagnosis not present

## 2022-12-24 DIAGNOSIS — H25813 Combined forms of age-related cataract, bilateral: Secondary | ICD-10-CM | POA: Diagnosis not present

## 2022-12-24 DIAGNOSIS — G514 Facial myokymia: Secondary | ICD-10-CM | POA: Diagnosis not present

## 2023-01-10 ENCOUNTER — Other Ambulatory Visit: Payer: Self-pay | Admitting: Internal Medicine

## 2023-01-21 DIAGNOSIS — I10 Essential (primary) hypertension: Secondary | ICD-10-CM | POA: Diagnosis not present

## 2023-02-17 DIAGNOSIS — Z79899 Other long term (current) drug therapy: Secondary | ICD-10-CM | POA: Diagnosis not present

## 2023-02-17 DIAGNOSIS — Z7189 Other specified counseling: Secondary | ICD-10-CM | POA: Diagnosis not present

## 2023-02-17 DIAGNOSIS — Z0000 Encounter for general adult medical examination without abnormal findings: Secondary | ICD-10-CM | POA: Diagnosis not present

## 2023-02-17 DIAGNOSIS — R5383 Other fatigue: Secondary | ICD-10-CM | POA: Diagnosis not present

## 2023-02-17 DIAGNOSIS — R739 Hyperglycemia, unspecified: Secondary | ICD-10-CM | POA: Diagnosis not present

## 2023-02-17 DIAGNOSIS — Z299 Encounter for prophylactic measures, unspecified: Secondary | ICD-10-CM | POA: Diagnosis not present

## 2023-02-17 DIAGNOSIS — E7800 Pure hypercholesterolemia, unspecified: Secondary | ICD-10-CM | POA: Diagnosis not present

## 2023-02-17 DIAGNOSIS — M8580 Other specified disorders of bone density and structure, unspecified site: Secondary | ICD-10-CM | POA: Diagnosis not present

## 2023-02-17 DIAGNOSIS — E559 Vitamin D deficiency, unspecified: Secondary | ICD-10-CM | POA: Diagnosis not present

## 2023-02-17 DIAGNOSIS — I10 Essential (primary) hypertension: Secondary | ICD-10-CM | POA: Diagnosis not present

## 2023-02-21 DIAGNOSIS — I10 Essential (primary) hypertension: Secondary | ICD-10-CM | POA: Diagnosis not present

## 2023-02-24 DIAGNOSIS — I10 Essential (primary) hypertension: Secondary | ICD-10-CM | POA: Diagnosis not present

## 2023-02-24 DIAGNOSIS — Z0000 Encounter for general adult medical examination without abnormal findings: Secondary | ICD-10-CM | POA: Diagnosis not present

## 2023-02-24 DIAGNOSIS — E119 Type 2 diabetes mellitus without complications: Secondary | ICD-10-CM | POA: Diagnosis not present

## 2023-02-24 DIAGNOSIS — E559 Vitamin D deficiency, unspecified: Secondary | ICD-10-CM | POA: Diagnosis not present

## 2023-02-24 DIAGNOSIS — Z299 Encounter for prophylactic measures, unspecified: Secondary | ICD-10-CM | POA: Diagnosis not present

## 2023-03-23 DIAGNOSIS — I10 Essential (primary) hypertension: Secondary | ICD-10-CM | POA: Diagnosis not present

## 2023-03-28 ENCOUNTER — Other Ambulatory Visit: Payer: Self-pay | Admitting: Internal Medicine

## 2023-03-28 DIAGNOSIS — Z1231 Encounter for screening mammogram for malignant neoplasm of breast: Secondary | ICD-10-CM

## 2023-03-31 ENCOUNTER — Ambulatory Visit
Admission: RE | Admit: 2023-03-31 | Discharge: 2023-03-31 | Disposition: A | Discharge status: Home / Self Care | Payer: Medicare Other | Source: Ambulatory Visit | Attending: Internal Medicine | Admitting: Internal Medicine

## 2023-03-31 DIAGNOSIS — Z1231 Encounter for screening mammogram for malignant neoplasm of breast: Secondary | ICD-10-CM

## 2023-04-22 DIAGNOSIS — I10 Essential (primary) hypertension: Secondary | ICD-10-CM | POA: Diagnosis not present

## 2023-05-22 DIAGNOSIS — I10 Essential (primary) hypertension: Secondary | ICD-10-CM | POA: Diagnosis not present

## 2023-06-22 DIAGNOSIS — I10 Essential (primary) hypertension: Secondary | ICD-10-CM | POA: Diagnosis not present

## 2023-07-11 DIAGNOSIS — R030 Elevated blood-pressure reading, without diagnosis of hypertension: Secondary | ICD-10-CM | POA: Diagnosis not present

## 2023-07-11 DIAGNOSIS — M791 Myalgia, unspecified site: Secondary | ICD-10-CM | POA: Diagnosis not present

## 2023-07-22 DIAGNOSIS — I10 Essential (primary) hypertension: Secondary | ICD-10-CM | POA: Diagnosis not present

## 2023-08-21 DIAGNOSIS — I10 Essential (primary) hypertension: Secondary | ICD-10-CM | POA: Diagnosis not present

## 2023-09-20 DIAGNOSIS — I10 Essential (primary) hypertension: Secondary | ICD-10-CM | POA: Diagnosis not present

## 2023-10-21 DIAGNOSIS — I10 Essential (primary) hypertension: Secondary | ICD-10-CM | POA: Diagnosis not present

## 2023-11-21 DIAGNOSIS — I10 Essential (primary) hypertension: Secondary | ICD-10-CM | POA: Diagnosis not present

## 2023-12-16 DIAGNOSIS — Z0000 Encounter for general adult medical examination without abnormal findings: Secondary | ICD-10-CM | POA: Diagnosis not present

## 2023-12-16 DIAGNOSIS — Z713 Dietary counseling and surveillance: Secondary | ICD-10-CM | POA: Diagnosis not present

## 2023-12-16 DIAGNOSIS — Z299 Encounter for prophylactic measures, unspecified: Secondary | ICD-10-CM | POA: Diagnosis not present

## 2023-12-16 DIAGNOSIS — Z7189 Other specified counseling: Secondary | ICD-10-CM | POA: Diagnosis not present

## 2023-12-16 DIAGNOSIS — R5383 Other fatigue: Secondary | ICD-10-CM | POA: Diagnosis not present

## 2023-12-16 DIAGNOSIS — Z1389 Encounter for screening for other disorder: Secondary | ICD-10-CM | POA: Diagnosis not present

## 2023-12-16 DIAGNOSIS — E7800 Pure hypercholesterolemia, unspecified: Secondary | ICD-10-CM | POA: Diagnosis not present

## 2023-12-16 DIAGNOSIS — I10 Essential (primary) hypertension: Secondary | ICD-10-CM | POA: Diagnosis not present

## 2023-12-21 DIAGNOSIS — I10 Essential (primary) hypertension: Secondary | ICD-10-CM | POA: Diagnosis not present

## 2024-01-21 DIAGNOSIS — I10 Essential (primary) hypertension: Secondary | ICD-10-CM | POA: Diagnosis not present

## 2024-02-20 DIAGNOSIS — I10 Essential (primary) hypertension: Secondary | ICD-10-CM | POA: Diagnosis not present

## 2024-02-29 DIAGNOSIS — H04123 Dry eye syndrome of bilateral lacrimal glands: Secondary | ICD-10-CM | POA: Diagnosis not present

## 2024-02-29 DIAGNOSIS — G514 Facial myokymia: Secondary | ICD-10-CM | POA: Diagnosis not present

## 2024-02-29 DIAGNOSIS — H25813 Combined forms of age-related cataract, bilateral: Secondary | ICD-10-CM | POA: Diagnosis not present

## 2024-02-29 DIAGNOSIS — H10413 Chronic giant papillary conjunctivitis, bilateral: Secondary | ICD-10-CM | POA: Diagnosis not present

## 2024-03-22 DIAGNOSIS — I10 Essential (primary) hypertension: Secondary | ICD-10-CM | POA: Diagnosis not present

## 2024-04-25 ENCOUNTER — Other Ambulatory Visit: Payer: Self-pay | Admitting: Internal Medicine

## 2024-04-25 DIAGNOSIS — Z1231 Encounter for screening mammogram for malignant neoplasm of breast: Secondary | ICD-10-CM

## 2024-04-26 ENCOUNTER — Ambulatory Visit
Admission: RE | Admit: 2024-04-26 | Discharge: 2024-04-26 | Disposition: A | Discharge status: Home / Self Care | Source: Ambulatory Visit | Attending: Internal Medicine | Admitting: Internal Medicine

## 2024-04-26 DIAGNOSIS — Z1231 Encounter for screening mammogram for malignant neoplasm of breast: Secondary | ICD-10-CM
